# Patient Record
Sex: Male | Born: 1945 | Race: White | Hispanic: No | Marital: Married | Smoking: Never smoker
Health system: Southern US, Community
[De-identification: ages and names within clinical notes are randomized; demographics above are authoritative.]

## PROBLEM LIST (undated history)

## (undated) DIAGNOSIS — M25512 Pain in left shoulder: Secondary | ICD-10-CM

## (undated) DIAGNOSIS — I2510 Atherosclerotic heart disease of native coronary artery without angina pectoris: Secondary | ICD-10-CM

## (undated) DIAGNOSIS — R5383 Other fatigue: Secondary | ICD-10-CM

## (undated) DIAGNOSIS — I209 Angina pectoris, unspecified: Secondary | ICD-10-CM

## (undated) DIAGNOSIS — R079 Chest pain, unspecified: Secondary | ICD-10-CM

## (undated) DIAGNOSIS — E785 Hyperlipidemia, unspecified: Secondary | ICD-10-CM

## (undated) HISTORY — PX: EYE SURGERY: SHX253

## (undated) HISTORY — DX: Hyperlipidemia, unspecified: E78.5

## (undated) HISTORY — PX: CARDIAC CATHETERIZATION: SHX172

## (undated) HISTORY — PX: OTHER SURGICAL HISTORY: SHX169

## (undated) HISTORY — DX: Other fatigue: R53.83

## (undated) HISTORY — DX: Pain in left shoulder: M25.512

## (undated) HISTORY — DX: Chest pain, unspecified: R07.9

---

## 2021-02-26 DIAGNOSIS — R079 Chest pain, unspecified: Secondary | ICD-10-CM | POA: Diagnosis not present

## 2021-02-26 DIAGNOSIS — M25512 Pain in left shoulder: Secondary | ICD-10-CM | POA: Diagnosis not present

## 2021-03-16 ENCOUNTER — Other Ambulatory Visit: Payer: Self-pay

## 2021-03-16 ENCOUNTER — Encounter: Payer: Self-pay | Admitting: Interventional Cardiology

## 2021-03-16 ENCOUNTER — Ambulatory Visit (INDEPENDENT_AMBULATORY_CARE_PROVIDER_SITE_OTHER): Payer: Federal, State, Local not specified - PPO | Admitting: Interventional Cardiology

## 2021-03-16 VITALS — BP 130/84 | HR 53 | Ht 69.0 in | Wt 170.2 lb

## 2021-03-16 DIAGNOSIS — Z8249 Family history of ischemic heart disease and other diseases of the circulatory system: Secondary | ICD-10-CM | POA: Diagnosis not present

## 2021-03-16 DIAGNOSIS — R072 Precordial pain: Secondary | ICD-10-CM | POA: Diagnosis not present

## 2021-03-16 DIAGNOSIS — R0609 Other forms of dyspnea: Secondary | ICD-10-CM

## 2021-03-16 DIAGNOSIS — R0600 Dyspnea, unspecified: Secondary | ICD-10-CM

## 2021-03-16 NOTE — Patient Instructions (Signed)
Medication Instructions:  Your physician recommends that you continue on your current medications as directed. Please refer to the Current Medication list given to you today.  *If you need a refill on your cardiac medications before your next appointment, please call your pharmacy*   Lab Work: Lab work to be done today--BMP If you have labs (blood work) drawn today and your tests are completely normal, you will receive your results only by: Marland Kitchen MyChart Message (if you have MyChart) OR . A paper copy in the mail If you have any lab test that is abnormal or we need to change your treatment, we will call you to review the results.   Testing/Procedures: Your physician has requested that you have an echocardiogram. Echocardiography is a painless test that uses sound waves to create images of your heart. It provides your doctor with information about the size and shape of your heart and how well your heart's chambers and valves are working. This procedure takes approximately one hour. There are no restrictions for this procedure.  Your physician has requested that you have cardiac CT. Cardiac computed tomography (CT) is a painless test that uses an x-ray machine to take clear, detailed pictures of your heart. For further information please visit [email or web address] Please follow instruction sheet as given.      Follow-Up: At [facility], you and your health needs are our priority.  As part of our continuing mission to provide you with exceptional heart care, we have created designated Provider Care Teams.  These Care Teams include your primary Cardiologist (physician) and Advanced Practice Providers (APPs -  Physician Assistants and Nurse Practitioners) who all work together to provide you with the care you need, when you need it.  We recommend signing up for the patient portal called "MyChart".  Sign up information is provided on this After Visit Summary.  MyChart is used to connect with  patients for Virtual Visits (Telemedicine).  Patients are able to view lab/test results, encounter notes, upcoming appointments, etc.  Non-urgent messages can be sent to your provider as well.   To learn more about what you can do with MyChart, go to [email or web address].    Your next appointment:   Based on test results  The format for your next appointment:   In Person  Provider:   You may see Lance Muss, MD or one of the following Advanced Practice Providers on your designated Care Team:    Ronie Spies, PA-C  Jacolyn Reedy, PA-C    Other Instructions Your cardiac CT will be scheduled at one of the below locations:   [facility] [address] [place], [place] [address] [phone]  OR  [facility] [address] [place], [place] [address] [phone]  If scheduled at [facility], please arrive at the [facility] main entrance (entrance A) of [facility] 30 minutes prior to test start time. Proceed to the [facility] Radiology Department (first floor) to check-in and test prep.  If scheduled at [facility], please arrive 15 mins early for check-in and test prep.  Please follow these instructions carefully (unless otherwise directed):  Hold all erectile dysfunction medications at least 3 days (72 hrs) prior to test.  On the Night Before the Test: . Be sure to Drink plenty of water. . Do not consume any caffeinated/decaffeinated beverages or chocolate 12 hours prior to your test. . Do not take any antihistamines 12 hours prior to your  test.  On the Day of the Test: . Drink plenty of water until 1 hour prior to the test. . Do not eat any food 4 hours prior to the test. . You may take your regular medications prior to the test.            After the Test: . Drink plenty of water. . After receiving IV contrast, you may experience a mild flushed feeling.  This is normal. . On occasion, you may experience a mild rash up to 24 hours after the test. This is not dangerous. If this occurs, you can take Benadryl 25 mg and increase your fluid intake. . If you experience trouble breathing, this can be serious. If it is severe call 911 IMMEDIATELY. If it is mild, please call our office. . If you take any of these medications: Glipizide/Metformin, Avandament, Glucavance, please do not take 48 hours after completing test unless otherwise instructed.   Once we have confirmed authorization from your insurance company, we will call you to set up a date and time for your test. Based on how quickly your insurance processes prior authorizations requests, please allow up to 4 weeks to be contacted for scheduling your Cardiac CT appointment. Be advised that routine Cardiac CT appointments could be scheduled as many as 8 weeks after your provider has ordered it.  For non-scheduling related questions, please contact the cardiac imaging nurse navigator should you have any questions/concerns: Rockwell Alexandria, Cardiac Imaging Nurse Navigator Larey Brick, Cardiac Imaging Nurse Navigator [facility] Direct Office Dial: [phone]   For scheduling needs, including cancellations and rescheduling, please call [place], [phone].

## 2021-03-16 NOTE — H&P (View-Only) (Signed)
Cardiology Office Note   Date:  03/16/2021   ID:  Andres Green, DOB 05-27-1946, MRN [id number]  PCP:  Sheliah Hatch, PA-C    No chief complaint on file.  Chest pain/fatigue  Wt Readings from Last 3 Encounters:  03/16/21 170 lb 3.2 oz (77.2 kg)       History of Present Illness: Andres Green is a 75 y.o. male who is being seen today for the evaluation of chest pain at the request of Sheliah Hatch, PA-C.  He has lived in the [place] for many years.  In his younger days, he was very active hiking, skiing.  He has been active gardening, building.    He got married seven years ago, and was doing more yard work/digging etc...  Last October he moved to [facility].  He has been moving furniture and rocks in the yard.  He walks his dog twice a day.    He recently has had some fatigue with activity in the yard.  He had some left chest pain and left arm pain as well.  Walks his dog, and this has become a cause of fatigue as well.   Father had CABG in his 43s after MI. Brother died from noncardiac related causes.   He had several stress tests in the past, last one about 20 years ago.   Past Medical History:  Diagnosis Date  . Chest pain   . Fatigue   . Left shoulder pain        No current outpatient medications on file.   No current facility-administered medications for this visit.    Allergies:   Patient has no allergy information on record.    Social History:  The patient  reports that he has never smoked. He has never used smokeless tobacco. He reports current alcohol use.   Family History:  The patient's family history includes Cancer - Prostate in his father; Diabetes in his mother; Heart disease in his father; Stroke in his mother.    ROS:  Please see the history of present illness.   Otherwise, review of systems are positive for fatigue as noted above.   All other systems are reviewed and negative.    PHYSICAL EXAM: VS:  BP 130/84   Pulse (!) 53   Ht 5'  9" (1.753 m)   Wt 170 lb 3.2 oz (77.2 kg)   SpO2 95%   BMI 25.13 kg/m  , BMI Body mass index is 25.13 kg/m. GEN: Well nourished, well developed, in no acute distress  HEENT: normal  Neck: no JVD, carotid bruits, or masses Cardiac: RRR; no murmurs, rubs, or gallops,no edema  Respiratory:  clear to auscultation bilaterally, normal work of breathing GI: soft, nontender, nondistended, + BS MS: no deformity or atrophy  Skin: warm and dry, no rash Neuro:  Strength and sensation are intact Psych: euthymic mood, full affect   EKG:   The ekg ordered by PMD demonstrates sinus brady, no ST changes   Recent Labs: No results found for requested labs within last 8760 hours.   Lipid Panel No results found for: CHOL, TRIG, HDL, CHOLHDL, VLDL, LDLCALC, LDLDIRECT   Other studies Reviewed: Additional studies/ records that were reviewed today with results demonstrating: normal renal function and Hbg.   ASSESSMENT AND PLAN:  1. Left arm pain/chest pain with fatigue- worse with exertion.   Some typical features, some atypical features.  Plan for CTA coronaries to further eval.  2. Family history of CAD:  Father with early CAD.  3. DOE: Plan for echo as well. Does not appear to be in CHF.    Current medicines are reviewed at length with the patient today.  The patient concerns regarding his medicines were addressed.  The following changes have been made:  No change  Labs/ tests ordered today include:  No orders of the defined types were placed in this encounter.   Recommend 150 minutes/week of aerobic exercise Low fat, low carb, high fiber diet recommended  Disposition:   FU in for tests.    Signed, Lance Muss, MD  03/16/2021 2:40 PM    [facility] [address] [place], [place], [place]  [address] Phone: [phone]; Fax: [phone]

## 2021-03-16 NOTE — Progress Notes (Signed)
  Cardiology Office Note   Date:  03/16/2021   ID:  Andres Green, DOB 01/26/1946, MRN [id number]  PCP:  Breedlove, Brent C, PA-C    No chief complaint on file.  Chest pain/fatigue  Wt Readings from Last 3 Encounters:  03/16/21 170 lb 3.2 oz (77.2 kg)       History of Present Illness: Andres Green is a 75 y.o. male who is being seen today for the evaluation of chest pain at the request of Breedlove, Brent C, PA-C.  [place] has lived in the [place].  In his younger days, he was very active hiking, skiing.  He has been active gardening, building.    He got married seven years ago, and was doing more yard work/digging etc...  Last October he moved to [place].  He has been moving furniture and rocks in the yard.  He walks his dog twice a day.    He recently has had some fatigue with activity in the yard.  He had some left chest pain and left arm pain as well.  Walks his dog, and this has become a cause of fatigue as well.   Father had CABG in his 50s after MI. Brother died from noncardiac related causes.   He had several stress tests in the past, last one about 20 years ago.   Past Medical History:  Diagnosis Date  . Chest pain   . Fatigue   . Left shoulder pain        No current outpatient medications on file.   No current facility-administered medications for this visit.    Allergies:   Patient has no allergy information on record.    Social History:  The patient  reports that he has never smoked. He has never used smokeless tobacco. He reports current alcohol use.   Family History:  The patient's family history includes Cancer - Prostate in his father; Diabetes in his mother; Heart disease in his father; Stroke in his mother.    ROS:  Please see the history of present illness.   Otherwise, review of systems are positive for fatigue as noted above.   All other systems are reviewed and negative.    PHYSICAL EXAM: VS:  BP 130/84   Pulse (!) 53   Ht 5'  9" (1.753 m)   Wt 170 lb 3.2 oz (77.2 kg)   SpO2 95%   BMI 25.13 kg/m  , BMI Body mass index is 25.13 kg/m. GEN: Well nourished, well developed, in no acute distress  HEENT: normal  Neck: no JVD, carotid bruits, or masses Cardiac: RRR; no murmurs, rubs, or gallops,no edema  Respiratory:  clear to auscultation bilaterally, normal work of breathing GI: soft, nontender, nondistended, + BS MS: no deformity or atrophy  Skin: warm and dry, no rash Neuro:  Strength and sensation are intact Psych: euthymic mood, full affect   EKG:   The ekg ordered by PMD demonstrates sinus brady, no ST changes   Recent Labs: No results found for requested labs within last 8760 hours.   Lipid Panel No results found for: CHOL, TRIG, HDL, CHOLHDL, VLDL, LDLCALC, LDLDIRECT   Other studies Reviewed: Additional studies/ records that were reviewed today with results demonstrating: normal renal function and Hbg.   ASSESSMENT AND PLAN:  1. Left arm pain/chest pain with fatigue- worse with exertion.   Some typical features, some atypical features.  Plan for CTA coronaries to further eval.  2. Family history of CAD:   Father with early CAD.  3. DOE: Plan for echo as well. Does not appear to be in CHF.    Current medicines are reviewed at length with the patient today.  The patient concerns regarding his medicines were addressed.  The following changes have been made:  No change  Labs/ tests ordered today include:  No orders of the defined types were placed in this encounter.   Recommend 150 minutes/week of aerobic exercise Low fat, low carb, high fiber diet recommended  Disposition:   FU in for tests.    Signed, Lance Muss, MD  03/16/2021 2:40 PM    [facility] [address] [place], [place], [place]  [address] Phone: [phone]; Fax: [phone]

## 2021-03-17 LAB — BASIC METABOLIC PANEL
BUN/Creatinine Ratio: 15 (ref 10–24)
BUN: 19 mg/dL (ref 8–27)
CO2: 25 mmol/L (ref 20–29)
Calcium: 9.6 mg/dL (ref 8.6–10.2)
Chloride: 103 mmol/L (ref 96–106)
Creatinine, Ser: 1.3 mg/dL — ABNORMAL HIGH (ref 0.76–1.27)
Glucose: 95 mg/dL (ref 65–99)
Potassium: 5.1 mmol/L (ref 3.5–5.2)
Sodium: 144 mmol/L (ref 134–144)
eGFR: 58 mL/min/{1.73_m2} — ABNORMAL LOW (ref >59)

## 2021-03-19 ENCOUNTER — Telehealth (HOSPITAL_COMMUNITY): Payer: Self-pay | Admitting: *Deleted

## 2021-03-19 NOTE — Telephone Encounter (Signed)
Reaching out to patient to offer assistance regarding upcoming cardiac imaging study; pt verbalizes understanding of appt date/time, parking situation and where to check in, pre-test NPO status, and verified current allergies; name and call back number provided for further questions should they arise  Naylene Foell RN Navigator Cardiac Imaging [facility] [phone] office [phone] cell  

## 2021-03-22 ENCOUNTER — Ambulatory Visit (HOSPITAL_COMMUNITY)
Admission: RE | Admit: 2021-03-22 | Discharge: 2021-03-22 | Disposition: A | Discharge status: Home / Self Care | Payer: Federal, State, Local not specified - PPO | Source: Ambulatory Visit | Attending: Interventional Cardiology | Admitting: Interventional Cardiology

## 2021-03-22 ENCOUNTER — Encounter (HOSPITAL_COMMUNITY): Payer: Self-pay

## 2021-03-22 ENCOUNTER — Other Ambulatory Visit: Payer: Self-pay

## 2021-03-22 DIAGNOSIS — R072 Precordial pain: Secondary | ICD-10-CM | POA: Diagnosis not present

## 2021-03-22 DIAGNOSIS — I2510 Atherosclerotic heart disease of native coronary artery without angina pectoris: Secondary | ICD-10-CM | POA: Diagnosis not present

## 2021-03-22 DIAGNOSIS — Z006 Encounter for examination for normal comparison and control in clinical research program: Secondary | ICD-10-CM

## 2021-03-22 MED ORDER — NITROGLYCERIN 0.4 MG SL SUBL
SUBLINGUAL_TABLET | SUBLINGUAL | Status: AC
  Filled 2021-03-22: qty 2

## 2021-03-22 MED ORDER — IOHEXOL 350 MG/ML SOLN
90.0000 mL | Freq: Once | INTRAVENOUS | Status: AC | PRN
  Administered 2021-03-22: 90 mL via INTRAVENOUS

## 2021-03-22 MED ORDER — METOPROLOL TARTRATE 5 MG/5ML IV SOLN: 5.0000 mg | Freq: Once | INTRAVENOUS | Status: AC

## 2021-03-22 MED ORDER — METOPROLOL TARTRATE 5 MG/5ML IV SOLN
INTRAVENOUS | Status: AC
  Administered 2021-03-22: 5 mg via INTRAVENOUS
  Filled 2021-03-22: qty 5

## 2021-03-22 MED ORDER — NITROGLYCERIN 0.4 MG SL SUBL
0.8000 mg | SUBLINGUAL_TABLET | Freq: Once | SUBLINGUAL | Status: AC
  Administered 2021-03-22: 0.8 mg via SUBLINGUAL

## 2021-03-22 NOTE — Research (Signed)
IDENTIFY Informed Consent                  Subject Name: Andres Green   Subject met inclusion and exclusion criteria.  The informed consent form, study requirements and expectations were reviewed with the subject and questions and concerns were addressed prior to the signing of the consent form.  The subject verbalized understanding of the trial requirements.  The subject agreed to participate in the IDENTIFY trial and signed the informed consent at 14:49PM on 03/22/21.  The informed consent was obtained prior to performance of any protocol-specific procedures for the subject.  A copy of the signed informed consent was given to the subject and a copy was placed in the subject's medical record.   Meade Maw , Naval architect

## 2021-03-23 ENCOUNTER — Encounter: Payer: Self-pay | Admitting: *Deleted

## 2021-03-23 ENCOUNTER — Telehealth: Payer: Self-pay | Admitting: *Deleted

## 2021-03-23 DIAGNOSIS — I2510 Atherosclerotic heart disease of native coronary artery without angina pectoris: Secondary | ICD-10-CM | POA: Diagnosis not present

## 2021-03-23 DIAGNOSIS — R072 Precordial pain: Secondary | ICD-10-CM

## 2021-03-23 DIAGNOSIS — Z01812 Encounter for preprocedural laboratory examination: Secondary | ICD-10-CM

## 2021-03-23 MED ORDER — NITROGLYCERIN 0.4 MG SL SUBL: 0.4000 mg | SUBLINGUAL_TABLET | SUBLINGUAL | 6 refills | Status: DC | PRN

## 2021-03-23 MED ORDER — ASPIRIN EC 81 MG PO TBEC: 81.0000 mg | DELAYED_RELEASE_TABLET | Freq: Every day | ORAL | 3 refills | Status: AC

## 2021-03-23 MED ORDER — ISOSORBIDE MONONITRATE ER 30 MG PO TB24
15.0000 mg | ORAL_TABLET | Freq: Every day | ORAL | 2 refills | Status: DC
Start: 2021-03-23 — End: 2021-04-13

## 2021-03-23 NOTE — Telephone Encounter (Signed)
I spoke with patient and gave him options of May 10 and May 13 for cath.  He will check with his family.  Patient aware he will need covid testing and will have to quarantine after test. Will also need BMP and CBC.   I gave patient medication instructions.  Use of NTG explained to patient.  Prescriptions sent to [facility] in [facility]. He has already started aspirin 81 mg daily today.

## 2021-03-23 NOTE — Progress Notes (Signed)
I spoke to the patient about his CT.  We agreed that cath is the next step.  Cardiac catheterization was discussed with the patient fully. The patient understands that risks include but are not limited to stroke (1 in 1000), death (1 in 1000), kidney failure [usually temporary] (1 in 500), bleeding (1 in 200), allergic reaction [possibly serious] (1 in 200).  The patient understands and is willing to proceed.    He has some dental work.  I advised him that it was likely his dentist would want his heart fixed prior to invasive dental work.  Start aspirin 81 mg daily.  PLease call in SL NTG.  OK to try Imdur 15 mg daily. Please schedule cath.

## 2021-03-23 NOTE — Telephone Encounter (Signed)
-----   Message from Corky Crafts, MD sent at 03/23/2021  1:28 PM EDT ----- I spoke to the patient about his CT.  We agreed that cath is the next step.  Cardiac catheterization was discussed with the patient fully. The patient understands that risks include but are not limited to stroke (1 in 1000), death (1 in 1000), kidney failure [usually temporary] (1 in 500), bleeding (1 in 200), allergic reaction [possibly serious] (1 in 200).  The patient understands and is willing to proceed.    He has some dental work.  I advised him that it was likely his dentist would want his heart fixed prior to invasive dental work.  Start aspirin 81 mg daily.  PLease call in SL NTG.  OK to try Imdur 15 mg daily. Please schedule cath.

## 2021-03-23 NOTE — Telephone Encounter (Signed)
Patient would like to have cath done on May 10.  He will have covid screen at 8:45 on May 9,2022.  He will stop by office on 5/4 for lab work. Cath scheduled for 03/30/21 at 10:30.  I verbally went over instructions with patient and will leave copy at front desk for him to pick up when he comes in for lab work on 5/4

## 2021-03-24 ENCOUNTER — Other Ambulatory Visit: Payer: Federal, State, Local not specified - PPO | Admitting: *Deleted

## 2021-03-24 ENCOUNTER — Other Ambulatory Visit: Payer: Self-pay

## 2021-03-24 DIAGNOSIS — Z01812 Encounter for preprocedural laboratory examination: Secondary | ICD-10-CM

## 2021-03-24 DIAGNOSIS — R072 Precordial pain: Secondary | ICD-10-CM | POA: Diagnosis not present

## 2021-03-25 LAB — BASIC METABOLIC PANEL
BUN/Creatinine Ratio: 16 (ref 10–24)
BUN: 17 mg/dL (ref 8–27)
CO2: 25 mmol/L (ref 20–29)
Calcium: 9.4 mg/dL (ref 8.6–10.2)
Chloride: 103 mmol/L (ref 96–106)
Creatinine, Ser: 1.05 mg/dL (ref 0.76–1.27)
Glucose: 96 mg/dL (ref 65–99)
Potassium: 4.4 mmol/L (ref 3.5–5.2)
Sodium: 141 mmol/L (ref 134–144)
eGFR: 75 mL/min/{1.73_m2} (ref >59)

## 2021-03-25 LAB — CBC
Hematocrit: 40.9 % (ref 37.5–51.0)
Hemoglobin: 14.2 g/dL (ref 13.0–17.7)
MCH: 32.5 pg (ref 26.6–33.0)
MCHC: 34.7 g/dL (ref 31.5–35.7)
MCV: 94 fL (ref 79–97)
Platelets: 223 10*3/uL (ref 150–450)
RBC: 4.37 x10E6/uL (ref 4.14–5.80)
RDW: 12.6 % (ref 11.6–15.4)
WBC: 6 10*3/uL (ref 3.4–10.8)

## 2021-03-29 ENCOUNTER — Other Ambulatory Visit (HOSPITAL_COMMUNITY)
Admission: RE | Admit: 2021-03-29 | Discharge: 2021-03-29 | Disposition: A | Discharge status: Home / Self Care | Payer: Federal, State, Local not specified - PPO | Source: Ambulatory Visit | Attending: Interventional Cardiology | Admitting: Interventional Cardiology

## 2021-03-29 ENCOUNTER — Telehealth: Payer: Self-pay | Admitting: *Deleted

## 2021-03-29 DIAGNOSIS — Z01812 Encounter for preprocedural laboratory examination: Secondary | ICD-10-CM | POA: Insufficient documentation

## 2021-03-29 DIAGNOSIS — Z20822 Contact with and (suspected) exposure to covid-19: Secondary | ICD-10-CM | POA: Insufficient documentation

## 2021-03-29 DIAGNOSIS — R5383 Other fatigue: Secondary | ICD-10-CM | POA: Diagnosis not present

## 2021-03-29 DIAGNOSIS — I2510 Atherosclerotic heart disease of native coronary artery without angina pectoris: Secondary | ICD-10-CM | POA: Diagnosis not present

## 2021-03-29 DIAGNOSIS — R931 Abnormal findings on diagnostic imaging of heart and coronary circulation: Secondary | ICD-10-CM | POA: Diagnosis not present

## 2021-03-29 DIAGNOSIS — R0609 Other forms of dyspnea: Secondary | ICD-10-CM | POA: Diagnosis not present

## 2021-03-29 DIAGNOSIS — Z8249 Family history of ischemic heart disease and other diseases of the circulatory system: Secondary | ICD-10-CM | POA: Diagnosis not present

## 2021-03-29 LAB — SARS CORONAVIRUS 2 (TAT 6-24 HRS): SARS Coronavirus 2: NEGATIVE

## 2021-03-29 NOTE — Telephone Encounter (Signed)
Pt contacted pre-catheterization scheduled at [facility] for: Tuesday Mar 30, 2021 10:30 AM Verified arrival time and place: [facility] Main Entrance [facility]) at: 8:30 AM   No solid food after midnight prior to cath, clear liquids until 5 AM day of procedure.   AM meds can be  taken pre-cath with sips of water including: ASA 81 mg   Confirmed patient has responsible adult to drive home post procedure and be with patient first 24 hours after arriving home: yes  You are allowed ONE visitor in the waiting room during the time you are at the hospital for your procedure. Both you and your visitor must wear a mask once you enter the hospital.  Reviewed procedure/mask/visitor instructions with patient.

## 2021-03-30 ENCOUNTER — Ambulatory Visit (HOSPITAL_COMMUNITY)
Admission: RE | Admit: 2021-03-30 | Discharge: 2021-03-30 | Disposition: A | Discharge status: Home / Self Care | Payer: Federal, State, Local not specified - PPO | Attending: Interventional Cardiology | Admitting: Interventional Cardiology

## 2021-03-30 ENCOUNTER — Other Ambulatory Visit: Payer: Self-pay | Admitting: *Deleted

## 2021-03-30 ENCOUNTER — Ambulatory Visit (HOSPITAL_COMMUNITY)
Admit: 2021-03-30 | Discharge: 2021-03-30 | Disposition: A | Discharge status: Home / Self Care | Payer: Federal, State, Local not specified - PPO | Attending: Surgery | Admitting: Surgery

## 2021-03-30 ENCOUNTER — Other Ambulatory Visit: Payer: Self-pay

## 2021-03-30 ENCOUNTER — Ambulatory Visit (HOSPITAL_COMMUNITY)
Admission: RE | Disposition: A | Discharge status: Home / Self Care | Payer: Self-pay | Source: Home / Self Care | Attending: Interventional Cardiology

## 2021-03-30 DIAGNOSIS — I2510 Atherosclerotic heart disease of native coronary artery without angina pectoris: Secondary | ICD-10-CM | POA: Diagnosis not present

## 2021-03-30 DIAGNOSIS — Z8249 Family history of ischemic heart disease and other diseases of the circulatory system: Secondary | ICD-10-CM | POA: Diagnosis not present

## 2021-03-30 DIAGNOSIS — R5383 Other fatigue: Secondary | ICD-10-CM | POA: Insufficient documentation

## 2021-03-30 DIAGNOSIS — Z20822 Contact with and (suspected) exposure to covid-19: Secondary | ICD-10-CM | POA: Insufficient documentation

## 2021-03-30 DIAGNOSIS — R931 Abnormal findings on diagnostic imaging of heart and coronary circulation: Secondary | ICD-10-CM

## 2021-03-30 DIAGNOSIS — R0609 Other forms of dyspnea: Secondary | ICD-10-CM | POA: Insufficient documentation

## 2021-03-30 HISTORY — PX: LEFT HEART CATH AND CORONARY ANGIOGRAPHY: CATH118249

## 2021-03-30 SURGERY — LEFT HEART CATH AND CORONARY ANGIOGRAPHY
Anesthesia: LOCAL

## 2021-03-30 MED ORDER — SODIUM CHLORIDE 0.9 % IV SOLN: INTRAVENOUS | Status: AC

## 2021-03-30 MED ORDER — VERAPAMIL HCL 2.5 MG/ML IV SOLN
INTRAVENOUS | Status: AC
  Filled 2021-03-30: qty 2

## 2021-03-30 MED ORDER — LABETALOL HCL 5 MG/ML IV SOLN: 10.0000 mg | INTRAVENOUS | Status: DC | PRN

## 2021-03-30 MED ORDER — FENTANYL CITRATE (PF) 100 MCG/2ML IJ SOLN
INTRAMUSCULAR | Status: AC
  Filled 2021-03-30: qty 2

## 2021-03-30 MED ORDER — IOHEXOL 350 MG/ML SOLN
INTRAVENOUS | Status: DC | PRN
  Administered 2021-03-30: 55 mL

## 2021-03-30 MED ORDER — SODIUM CHLORIDE 0.9 % IV SOLN: 250.0000 mL | INTRAVENOUS | Status: DC | PRN

## 2021-03-30 MED ORDER — SODIUM CHLORIDE 0.9% FLUSH: 3.0000 mL | INTRAVENOUS | Status: DC | PRN

## 2021-03-30 MED ORDER — ASPIRIN 81 MG PO CHEW: 81.0000 mg | CHEWABLE_TABLET | ORAL | Status: DC

## 2021-03-30 MED ORDER — HEPARIN (PORCINE) IN NACL 1000-0.9 UT/500ML-% IV SOLN
INTRAVENOUS | Status: AC
  Filled 2021-03-30: qty 500

## 2021-03-30 MED ORDER — ACETAMINOPHEN 325 MG PO TABS: 650.0000 mg | ORAL_TABLET | ORAL | Status: DC | PRN

## 2021-03-30 MED ORDER — MIDAZOLAM HCL 2 MG/2ML IJ SOLN
INTRAMUSCULAR | Status: AC
  Filled 2021-03-30: qty 2

## 2021-03-30 MED ORDER — SODIUM CHLORIDE 0.9 % WEIGHT BASED INFUSION: 1.0000 mL/kg/h | INTRAVENOUS | Status: DC

## 2021-03-30 MED ORDER — FENTANYL CITRATE (PF) 100 MCG/2ML IJ SOLN
INTRAMUSCULAR | Status: DC | PRN
  Administered 2021-03-30: 25 ug via INTRAVENOUS

## 2021-03-30 MED ORDER — HYDRALAZINE HCL 20 MG/ML IJ SOLN: 10.0000 mg | INTRAMUSCULAR | Status: DC | PRN

## 2021-03-30 MED ORDER — LIDOCAINE HCL (PF) 1 % IJ SOLN
INTRAMUSCULAR | Status: AC
  Filled 2021-03-30: qty 30

## 2021-03-30 MED ORDER — LIDOCAINE HCL (PF) 1 % IJ SOLN
INTRAMUSCULAR | Status: DC | PRN
  Administered 2021-03-30: 2 mL

## 2021-03-30 MED ORDER — ONDANSETRON HCL 4 MG/2ML IJ SOLN
4.0000 mg | Freq: Four times a day (QID) | INTRAMUSCULAR | Status: DC | PRN
Start: 2021-03-30 — End: 2021-03-30

## 2021-03-30 MED ORDER — SODIUM CHLORIDE 0.9% FLUSH: 3.0000 mL | Freq: Two times a day (BID) | INTRAVENOUS | Status: DC

## 2021-03-30 MED ORDER — MIDAZOLAM HCL 2 MG/2ML IJ SOLN
INTRAMUSCULAR | Status: DC | PRN
  Administered 2021-03-30: 2 mg via INTRAVENOUS

## 2021-03-30 MED ORDER — HEPARIN (PORCINE) IN NACL 1000-0.9 UT/500ML-% IV SOLN
INTRAVENOUS | Status: DC | PRN
  Administered 2021-03-30 (×2): 500 mL

## 2021-03-30 MED ORDER — SODIUM CHLORIDE 0.9 % WEIGHT BASED INFUSION
3.0000 mL/kg/h | INTRAVENOUS | Status: AC
  Administered 2021-03-30: 3 mL/kg/h via INTRAVENOUS

## 2021-03-30 MED ORDER — HEPARIN SODIUM (PORCINE) 1000 UNIT/ML IJ SOLN
INTRAMUSCULAR | Status: AC
  Filled 2021-03-30: qty 1

## 2021-03-30 MED ORDER — HEPARIN SODIUM (PORCINE) 1000 UNIT/ML IJ SOLN
INTRAMUSCULAR | Status: DC | PRN
  Administered 2021-03-30: 4000 [IU] via INTRAVENOUS

## 2021-03-30 SURGICAL SUPPLY — 9 items
CATH 5FR JL3.5 JR4 ANG PIG MP (CATHETERS) ×2 IMPLANT
DEVICE RAD COMP TR BAND LRG (VASCULAR PRODUCTS) ×2 IMPLANT
GLIDESHEATH SLEND SS 6F .021 (SHEATH) ×2 IMPLANT
GUIDEWIRE INQWIRE 1.5J.035X260 (WIRE) ×1 IMPLANT
INQWIRE 1.5J .035X260CM (WIRE) ×2
KIT HEART LEFT (KITS) ×2 IMPLANT
PACK CARDIAC CATHETERIZATION (CUSTOM PROCEDURE TRAY) ×2 IMPLANT
TRANSDUCER W/STOPCOCK (MISCELLANEOUS) ×2 IMPLANT
TUBING CIL FLEX 10 FLL-RA (TUBING) ×2 IMPLANT

## 2021-03-30 NOTE — Progress Notes (Signed)
  Echocardiogram 2D Echocardiogram has been attempted. Patient Discharged. Nurse notified.  Andres Green G Andres Green 03/30/2021, 1:12 PM

## 2021-03-30 NOTE — Discharge Instructions (Signed)
Radial Site Care  This sheet gives you information about how to care for yourself after your procedure. Your health care provider may also give you more specific instructions. If you have problems or questions, contact your health care provider. What can I expect after the procedure? After the procedure, it is common to have:  Bruising and tenderness at the catheter insertion area. Follow these instructions at home: Medicines  Take over-the-counter and prescription medicines only as told by your health care provider. Insertion site care  Follow instructions from your health care provider about how to take care of your insertion site. Make sure you: ? Wash your hands with soap and water before you change your bandage (dressing). If soap and water are not available, use hand sanitizer. ? Change your dressing as told by your health care provider. ? Leave stitches (sutures), skin glue, or adhesive strips in place. These skin closures may need to stay in place for 2 weeks or longer. If adhesive strip edges start to loosen and curl up, you may trim the loose edges. Do not remove adhesive strips completely unless your health care provider tells you to do that.  Check your insertion site every day for signs of infection. Check for: ? Redness, swelling, or pain. ? Fluid or blood. ? Pus or a bad smell. ? Warmth.  Do not take baths, swim, or use a hot tub until your health care provider approves.  You may shower 24-48 hours after the procedure, or as directed by your health care provider. ? Remove the dressing and gently wash the site with plain soap and water. ? Pat the area dry with a clean towel. ? Do not rub the site. That could cause bleeding.  Do not apply powder or lotion to the site. Activity  For 24 hours after the procedure, or as directed by your health care provider: ? Do not flex or bend the affected arm. ? Do not push or pull heavy objects with the affected arm. ? Do not drive  yourself home from the hospital or clinic. You may drive 24 hours after the procedure unless your health care provider tells you not to. ? Do not operate machinery or power tools.  Do not lift anything that is heavier than 10 lb (4.5 kg), or the limit that you are told, until your health care provider says that it is safe.  Ask your health care provider when it is okay to: ? Return to work or school. ? Resume usual physical activities or sports. ? Resume sexual activity.   General instructions  If the catheter site starts to bleed, raise your arm and put firm pressure on the site. If the bleeding does not stop, get help right away. This is a medical emergency.  If you went home on the same day as your procedure, a responsible adult should be with you for the first 24 hours after you arrive home.  Keep all follow-up visits as told by your health care provider. This is important. Contact a health care provider if:  You have a fever.  You have redness, swelling, or yellow drainage around your insertion site. Get help right away if:  You have unusual pain at the radial site.  The catheter insertion area swells very fast.  The insertion area is bleeding, and the bleeding does not stop when you hold steady pressure on the area.  Your arm or hand becomes pale, cool, tingly, or numb. These symptoms may represent a serious   problem that is an emergency. Do not wait to see if the symptoms will go away. Get medical help right away. Call your local emergency services (911 in the [place] Do not drive yourself to the hospital. Summary  After the procedure, it is common to have bruising and tenderness at the site.  Follow instructions from your health care provider about how to take care of your radial site wound. Check the wound every day for signs of infection.  Do not lift anything that is heavier than 10 lb (4.5 kg), or the limit that you are told, until your health care provider says that it  is safe. This information is not intended to replace advice given to you by your health care provider. Make sure you discuss any questions you have with your health care provider. Document Revised: 12/13/2017 Document Reviewed: 12/13/2017 Elsevier Patient Education  2021 Elsevier Inc.  

## 2021-03-30 NOTE — Interval H&P Note (Signed)
Cath Lab Visit (complete for each Cath Lab visit)  Clinical Evaluation Leading to the Procedure:   ACS: No.  Non-ACS:    Anginal Classification: CCS III  Anti-ischemic medical therapy: Minimal Therapy (1 class of medications)  Non-Invasive Test Results: No non-invasive testing performed  Prior CABG: No previous CABG  Abnormal CTA coronaries    History and Physical Interval Note:  03/30/2021 8:51 AM  Andres Green  has presented today for surgery, with the diagnosis of Chest Pain.  The various methods of treatment have been discussed with the patient and family. After consideration of risks, benefits and other options for treatment, the patient has consented to  Procedure(s): LEFT HEART CATH AND CORONARY ANGIOGRAPHY (N/A) as a surgical intervention.  The patient's history has been reviewed, patient examined, no change in status, stable for surgery.  I have reviewed the patient's chart and labs.  Questions were answered to the patient's satisfaction.     Lance Muss

## 2021-03-31 ENCOUNTER — Encounter: Payer: Self-pay | Admitting: *Deleted

## 2021-03-31 ENCOUNTER — Encounter (HOSPITAL_COMMUNITY): Payer: Self-pay | Admitting: Interventional Cardiology

## 2021-03-31 ENCOUNTER — Other Ambulatory Visit: Payer: Self-pay | Admitting: *Deleted

## 2021-03-31 ENCOUNTER — Institutional Professional Consult (permissible substitution): Payer: Federal, State, Local not specified - PPO | Admitting: Surgery

## 2021-03-31 VITALS — BP 131/69 | HR 64 | Resp 20 | Ht 68.0 in | Wt 172.0 lb

## 2021-03-31 DIAGNOSIS — I2510 Atherosclerotic heart disease of native coronary artery without angina pectoris: Secondary | ICD-10-CM | POA: Diagnosis not present

## 2021-03-31 MED FILL — Verapamil HCl IV Soln 2.5 MG/ML: INTRAVENOUS | Qty: 2 | Status: AC

## 2021-03-31 NOTE — Progress Notes (Signed)
Cardiothoracic Surgery Consultation  PCP is Verl Blalock Referring Provider is Corky Crafts, MD  Chief Complaint  Patient presents with  . Coronary Artery Disease    Initial surgical consult, cath 5/10, ECHO 5/11    HPI:  The patient is a 75 year old gentleman with no prior cardiac history who reports developing fatigue and substernal chest discomfort radiating down his left arm while pushing a lawnmower a couple weeks ago.  This resolved with rest but he had a couple more episodes and presented to [facility] internal medicine in [facility] where he lives.  He said that initial testing did not show anything.  He had further episodes and return to his PCP and was referred to cardiology.  He underwent a gated cardiac CTA on 03/22/2021 which showed a calcium score of 382 putting him in the 62nd percentile for his age and sex.  There was significant disease in the LAD and further FFR analysis showed a significant decrease in the mid and distal LAD of 0.53, 0.54, and 0.50.  The first diagonal also was abnormal at its bifurcation from the LAD with an FFR of 0.54.  The left main, left circumflex and right coronary artery were normal.  He subsequently underwent cardiac catheterization yesterday which showed severe three-vessel coronary disease with a 90% proximal LAD stenosis with 50% mid and distal stenoses..  The first diagonal had about 75% ostial stenosis.  The left circumflex had 75% proximal to mid stenosis with 80% stenosis and a second marginal branch.  The right coronary artery was diffusely irregular with 90% stenosis at the ostium of the PDA.  Left ventricular ejection fraction was 55 to 65%.  There is no aortic stenosis.  The patient is married and lives with his wife in [place].  He lived in [place] for many years and just moved to [place] in the past year.  He and his wife care for their 32-year-old autistic grandson.  He is a  lifelong non-smoker.  His father had coronary bypass surgery after an MI in his 62s.  Past Medical History:  Diagnosis Date  . Chest pain   . Fatigue   . Left shoulder pain     Past Surgical History:  Procedure Laterality Date  . LEFT HEART CATH AND CORONARY ANGIOGRAPHY N/A 03/30/2021   Procedure: LEFT HEART CATH AND CORONARY ANGIOGRAPHY;  Surgeon: Corky Crafts, MD;  Location: [facility] INVASIVE CV LAB;  Service: Cardiovascular;  Laterality: N/A;  . WIDSOM TOOTH REMOVAL      Family History  Problem Relation Age of Onset  . Stroke Mother   . Diabetes Mother   . Heart disease Father   . Cancer - Prostate Father     Social History Social History   Tobacco Use  . Smoking status: Never Smoker  . Smokeless tobacco: Never Used  Substance Use Topics  . Alcohol use: Yes    Current Outpatient Medications  Medication Sig Dispense Refill  . acetaminophen (TYLENOL) 500 MG tablet Take 500-1,000 mg by mouth every 6 (six) hours as needed (headaches/pain).    Marland Kitchen aspirin EC 81 MG tablet Take 1 tablet (81 mg total) by mouth daily. Swallow whole. (Patient taking differently: Take 81 mg by mouth in the morning. Swallow whole.) 90 tablet 3  . calamine lotion Apply 1 application topically as needed (poison ivy rash).    . isosorbide mononitrate (IMDUR) 30 MG 24 hr tablet Take 0.5 tablets (15 mg total)  by mouth daily. (Patient taking differently: Take 15 mg by mouth in the morning.) 45 tablet 2  . nitroGLYCERIN (NITROSTAT) 0.4 MG SL tablet Place 1 tablet (0.4 mg total) under the tongue every 5 (five) minutes as needed for chest pain. (Patient taking differently: Place 0.4 mg under the tongue every 5 (five) minutes x 3 doses as needed for chest pain.) 25 tablet 6   No current facility-administered medications for this visit.    No Known Allergies  Review of Systems  Constitutional: Positive for activity change and fatigue.  HENT: Negative.   Eyes: Negative.   Respiratory: Positive for chest  tightness and shortness of breath.   Cardiovascular: Positive for chest pain. Negative for palpitations and leg swelling.  Endocrine: Negative.   Genitourinary: Negative.   Musculoskeletal: Negative.   Allergic/Immunologic: Negative.   Neurological: Negative for dizziness and syncope.  Hematological: Negative.   Psychiatric/Behavioral: Negative.     BP 131/69 (BP Location: Right Arm, Patient Position: Sitting)   Pulse 64   Resp 20   Ht 5\' 8"  (1.727 m)   Wt 172 lb (78 kg)   SpO2 98% Comment: RA  BMI 26.15 kg/m  Physical Exam Constitutional:      Appearance: Normal appearance. He is normal weight.  HENT:     Head: Normocephalic and atraumatic.  Eyes:     Extraocular Movements: Extraocular movements intact.     Conjunctiva/sclera: Conjunctivae normal.     Pupils: Pupils are equal, round, and reactive to light.  Neck:     Vascular: No carotid bruit.  Cardiovascular:     Rate and Rhythm: Normal rate and regular rhythm.     Heart sounds: No murmur heard.   Pulmonary:     Effort: Pulmonary effort is normal.     Breath sounds: Normal breath sounds.  Abdominal:     General: Abdomen is flat.     Palpations: Abdomen is soft.  Musculoskeletal:        General: No swelling. Normal range of motion.     Cervical back: Normal range of motion and neck supple.  Lymphadenopathy:     Cervical: No cervical adenopathy.  Skin:    General: Skin is warm and dry.  Neurological:     General: No focal deficit present.     Mental Status: He is alert and oriented to person, place, and time.  Psychiatric:        Mood and Affect: Mood normal.        Behavior: Behavior normal.        Thought Content: Thought content normal.        Judgment: Judgment normal.      Diagnostic Tests:  Physicians  Panel Physicians Referring Physician Case Authorizing Physician  Corky CraftsVaranasi, Jayadeep S, MD (Primary)      Procedures  LEFT HEART CATH AND CORONARY ANGIOGRAPHY   Conclusion    Prox LAD  lesion is 90% stenosed.  1st Diag lesion is 75% stenosed.  Mid LAD lesion is 50% stenosed.  2nd Diag lesion is 75% stenosed.  Dist LAD lesion is 50% stenosed.  2nd Mrg lesion is 80% stenosed.  Prox Cx to Mid Cx lesion is 75% stenosed.  Ost Cx lesion is 50% stenosed.  RPDA lesion is 90% stenosed.  The left ventricular systolic function is normal.  LV end diastolic pressure is normal.  The left ventricular ejection fraction is 55-65% by visual estimate.  There is no aortic valve stenosis.   Cardiac surgery consult planned due  to multivessel disease.    Patient not having any rest pain.  I stressed to him and his wife that he needs to avoid any exertion that brings on chest pressure.     Recommendations  Antiplatelet/Anticoag Recommend uninterrupted dual antiplatelet therapy with Aspirin 81mg  daily.  Discharge Date In the absence of any other complications or medical issues, we expect the patient to be ready for discharge from a cath perspective on 03/30/2021.   Indications  Abnormal cardiac CT angiography [R93.1 (ICD-10-CM)]   Procedural Details  Technical Details The risks, benefits, and details of the procedure were explained to the patient.  The patient verbalized understanding and wanted to proceed.  Informed written consent was obtained.  PROCEDURE TECHNIQUE:  After Xylocaine anesthesia a 14F slender sheath was placed in the right radial artery with a single anterior needle wall stick.   IV Heparin was given.  Left ventriculography was done using a JR4 catheter. Right coronary angiography was done using a Judkins R4 guide catheter.  Left coronary angiography was done using a Judkins L3.5 guide catheter.   A TR band was used for hemostasis.  Contrast: 55 cc  Estimated blood loss <50 mL.   During this procedure medications were administered to achieve and maintain moderate conscious sedation while the patient's heart rate, blood pressure, and oxygen saturation were  continuously monitored and I was present face-to-face 100% of this time.   Medications (Filter: Administrations occurring from 0940 to 1011 on 03/30/21) (important) Continuous medications are totaled by the amount administered until 03/30/21 1011.    Heparin (Porcine) in NaCl 1000-0.9 UT/500ML-% SOLN (mL) Total volume:  1,000 mL  Date/Time Rate/Dose/Volume Action   03/30/21 0942 500 mL Given   0943 500 mL Given    fentaNYL (SUBLIMAZE) injection (mcg) Total dose:  25 mcg  Date/Time Rate/Dose/Volume Action   03/30/21 0943 25 mcg Given    midazolam (VERSED) injection (mg) Total dose:  2 mg  Date/Time Rate/Dose/Volume Action   03/30/21 0943 2 mg Given    lidocaine (PF) (XYLOCAINE) 1 % injection (mL) Total volume:  2 mL  Date/Time Rate/Dose/Volume Action   03/30/21 0948 2 mL Given    heparin sodium (porcine) injection (Units) Total dose:  4,000 Units  Date/Time Rate/Dose/Volume Action   03/30/21 0950 4,000 Units Given    iohexol (OMNIPAQUE) 350 MG/ML injection (mL) Total volume:  55 mL  Date/Time Rate/Dose/Volume Action   03/30/21 1009 55 mL Given    Sedation Time  Sedation Time Physician-1: 19 minutes 32 seconds   Contrast  Medication Name Total Dose  iohexol (OMNIPAQUE) 350 MG/ML injection 55 mL    Radiation/Fluoro  Fluoro time: 2.4 (min) DAP: 14575 (mGycm2) Cumulative Air Kerma: 295 (mGy)   Complications   Complications documented before study signed (03/30/2021 10:37 AM)    No complications were associated with this study.  Documented by 05/30/2021, MD - 03/30/2021 10:25 AM     Coronary Findings   Diagnostic Dominance: Right  Left Anterior Descending  Prox LAD lesion is 90% stenosed.  Mid LAD lesion is 50% stenosed.  Dist LAD lesion is 50% stenosed.  First Diagonal Branch  1st Diag lesion is 75% stenosed.  Second Diagonal Branch  Vessel is small in size.  2nd Diag lesion is 75% stenosed.  Left Circumflex  Ost Cx lesion  is 50% stenosed. The lesion is eccentric.  Prox Cx to Mid Cx lesion is 75% stenosed.  First Obtuse Marginal Branch  Vessel is small in size.  Second Obtuse Marginal Branch  2nd Mrg lesion is 80% stenosed.  Right Coronary Artery  The vessel exhibits minimal luminal irregularities.  Right Posterior Descending Artery  RPDA lesion is 90% stenosed.   Intervention   No interventions have been documented.  Wall Motion  Resting       All segments of the heart are normal.           Left Heart  Left Ventricle The left ventricular size is normal. The left ventricular systolic function is normal. LV end diastolic pressure is normal. The left ventricular ejection fraction is 55-65% by visual estimate. No regional wall motion abnormalities.  Aortic Valve There is no aortic valve stenosis.   Coronary Diagrams   Diagnostic Dominance: Right    Intervention    Implants    No implant documentation for this case.   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  I agree that the best treatment for him is coronary bypass graft surgery to resolve his symptoms, increase his quality of life, and improve his long-term prognosis.  I reviewed the cardiac cath films with him and answered all of his questions.  I discussed the operative procedure with the patient including  alternatives, benefits and risks; including but not limited to bleeding, blood transfusion, infection, stroke, myocardial infarction, graft failure, heart block requiring a permanent pacemaker, organ dysfunction, and death.  Shelly Flatten understands and agrees to proceed.   Plan:  Coronary bypass graft surgery on Thursday, 04/08/2021.  I spent 60 minutes performing this consultation and > 50% of this time was spent face to face counseling and coordinating the care of this patient's severe multivessel coronary disease.  Alleen Borne, MD [facility] [phone]

## 2021-04-01 ENCOUNTER — Encounter: Payer: Federal, State, Local not specified - PPO | Admitting: Surgery

## 2021-04-01 ENCOUNTER — Other Ambulatory Visit: Payer: Self-pay

## 2021-04-01 ENCOUNTER — Ambulatory Visit (HOSPITAL_BASED_OUTPATIENT_CLINIC_OR_DEPARTMENT_OTHER)
Admission: RE | Admit: 2021-04-01 | Discharge: 2021-04-01 | Disposition: A | Discharge status: Home / Self Care | Payer: Federal, State, Local not specified - PPO | Source: Ambulatory Visit | Attending: Surgery | Admitting: Surgery

## 2021-04-01 ENCOUNTER — Ambulatory Visit (HOSPITAL_COMMUNITY)
Admission: RE | Admit: 2021-04-01 | Discharge: 2021-04-01 | Disposition: A | Discharge status: Home / Self Care | Payer: Federal, State, Local not specified - PPO | Source: Ambulatory Visit | Attending: Surgery | Admitting: Surgery

## 2021-04-01 DIAGNOSIS — I080 Rheumatic disorders of both mitral and aortic valves: Secondary | ICD-10-CM | POA: Diagnosis not present

## 2021-04-01 DIAGNOSIS — I2510 Atherosclerotic heart disease of native coronary artery without angina pectoris: Secondary | ICD-10-CM

## 2021-04-01 LAB — ECHOCARDIOGRAM COMPLETE
Area-P 1/2: 1.94 cm2
S' Lateral: 3 cm

## 2021-04-01 NOTE — Progress Notes (Signed)
  Echocardiogram 2D Echocardiogram has been performed.  Augustine Radar 04/01/2021, 2:42 PM

## 2021-04-05 NOTE — Progress Notes (Addendum)
Anesthesia Note:  Case: [id number] Date/Time: 04/08/21 0730   Procedures:      CORONARY ARTERY BYPASS GRAFTING (CABG) (N/A Chest)     TRANSESOPHAGEAL ECHOCARDIOGRAM (TEE) (N/A )   Anesthesia type: General   Pre-op diagnosis: CAD   [facility] ROOM [facility] / [facility] OR   Surgeons: Alleen Borne, MD      DISCUSSION: Patient is a 75 year old male scheduled for the above procedure. PAT is scheduled for 04/06/21. He moved to [place] from the [place] ~ 08/2020. He is a never smoker who noticed fatigue and exertional left chest and arm pain while doing yard work earlier this year. + Family history of early CAD in his father. Work-up ultimately included a cardiac cath showing multivessel CAD including 90% proximal LAD. Known stable angina symptoms. Advised to avoid strenuous activity and referred to CT surgery for CABG. On Imur. Baseline EKG SB at 45 bpm. PAT is scheduled for 04/06/21.   Received a message for [facility] RN Ryan on 04/05/21 indicating the "this patient has poison ivy on his tummy and thighs. He is for CABG on Thursday with Bartle. Bartle is aware and good to proceed as long as it doesn't become severe and we plan to start Benadryl tonight."  UPDATE 04/07/21 10:37 AM: Preoperative results reviewed. Anesthesia team to evaluate on the day of surgery.   VS: BP 108/63   Pulse (!) 52   Temp 37.2 C (Oral)   Resp 20   Ht 5\' 9"  (1.753 m)   Wt 77.7 kg   SpO2 100%   BMI 25.31 kg/m   PROVIDERS: , PA-C is PCP  Sheliah Hatch, MD is cardiologist   LABS: Pending PAT visit.  (all labs ordered are listed, but only abnormal results are displayed)  Labs Reviewed  COMPREHENSIVE METABOLIC PANEL - Abnormal; Notable for the following components:      Result Value   Glucose, Bld 102 (*)    Total Protein 6.4 (*)    AST 14 (*)    All other components within normal limits  URINALYSIS, ROUTINE W REFLEX MICROSCOPIC - Abnormal; Notable for the following components:   Color, Urine AMBER  (*)    All other components within normal limits  BLOOD GAS, ARTERIAL - Abnormal; Notable for the following components:   Allens test (pass/fail) BRACHIAL ARTERY (*)    All other components within normal limits  SURGICAL PCR SCREEN  SARS CORONAVIRUS 2 (TAT 6-24 HRS)  CBC  PROTIME-INR  APTT  HEMOGLOBIN A1C  TYPE AND SCREEN    IMAGES: CXR 04/06/21: FINDINGS: Cardiomediastinal silhouette within normal limits in size and contour. No evidence of central vascular congestion. No interlobular septal thickening. No pneumothorax or pleural effusion. Coarsened interstitial markings, with no confluent airspace disease. No acute displaced fracture.  Degenerative changes of the spine. IMPRESSION: No active cardiopulmonary disease.  CT Chest Over-Read CT Coronary 03/22/21: IMPRESSION: No significant extracardiac findings.   EKG: 03/30/21: SB at 45 bpm   CV: Carotid 05/30/21 04/01/21: Summary:  Right Carotid: The extracranial vessels were near-normal with only minimal wall thickening or plaque.  Left Carotid: The extracranial vessels were near-normal with only minimal wall thickening or plaque.    Echo 04/01/21: IMPRESSIONS  1. Left ventricular ejection fraction, by estimation, is 55 to 60%. The  left ventricle has normal function. The left ventricle has no regional  wall motion abnormalities. Left ventricular diastolic parameters are  consistent with Grade I diastolic  dysfunction (impaired relaxation).  2. Right ventricular systolic function is normal. The right ventricular  size is normal. There is normal pulmonary artery systolic pressure. The  estimated right ventricular systolic pressure is 23.1 mmHg.  3. The mitral valve is normal in structure. Mild mitral valve  regurgitation. No evidence of mitral stenosis.  4. The aortic valve has an indeterminant number of cusps. Aortic valve  regurgitation is not visualized. Mild aortic valve sclerosis is present,  with no evidence of  aortic valve stenosis.  5. The inferior vena cava is normal in size with greater than 50%  respiratory variability, suggesting right atrial pressure of 3 mmHg.    Cardiac cath 03/30/21 (done by Lance Muss, MD after abnormal coronary CT 03/22/21):   Prox LAD lesion is 90% stenosed.  1st Diag lesion is 75% stenosed.  Mid LAD lesion is 50% stenosed.  2nd Diag lesion is 75% stenosed.  Dist LAD lesion is 50% stenosed.  2nd Mrg lesion is 80% stenosed.  Prox Cx to Mid Cx lesion is 75% stenosed.  Ost Cx lesion is 50% stenosed.  RPDA lesion is 90% stenosed.  The left ventricular systolic function is normal.  LV end diastolic pressure is normal.  The left ventricular ejection fraction is 55-65% by visual estimate.  There is no aortic valve stenosis.   Cardiac surgery consult planned due to multivessel disease.    Patient not having any rest pain.  I stressed to him and his wife that he needs to avoid any exertion that brings on chest pressure.     Past Medical History:  Diagnosis Date  . Chest pain   . Fatigue   . Left shoulder pain     Past Surgical History:  Procedure Laterality Date  . LEFT HEART CATH AND CORONARY ANGIOGRAPHY N/A 03/30/2021   Procedure: LEFT HEART CATH AND CORONARY ANGIOGRAPHY;  Surgeon: Corky Crafts, MD;  Location: [facility] INVASIVE CV LAB;  Service: Cardiovascular;  Laterality: N/A;  . WIDSOM TOOTH REMOVAL      MEDICATIONS: . acetaminophen (TYLENOL) 500 MG tablet  . aspirin EC 81 MG tablet  . calamine lotion  . isosorbide mononitrate (IMDUR) 30 MG 24 hr tablet  . nitroGLYCERIN (NITROSTAT) 0.4 MG SL tablet   No current facility-administered medications for this encounter.    Shonna Chock, PA-C Surgical Short Stay/Anesthesiology [facility] Phone [phone] [facility] Phone [phone] 04/05/2021 4:31 PM

## 2021-04-06 ENCOUNTER — Encounter (HOSPITAL_COMMUNITY): Payer: Self-pay

## 2021-04-06 ENCOUNTER — Encounter (HOSPITAL_COMMUNITY)
Admission: RE | Admit: 2021-04-06 | Discharge: 2021-04-06 | Disposition: A | Discharge status: Home / Self Care | Payer: Federal, State, Local not specified - PPO | Source: Ambulatory Visit | Attending: Surgery | Admitting: Surgery

## 2021-04-06 ENCOUNTER — Other Ambulatory Visit: Payer: Self-pay

## 2021-04-06 ENCOUNTER — Ambulatory Visit (HOSPITAL_COMMUNITY)
Admission: RE | Admit: 2021-04-06 | Discharge: 2021-04-06 | Disposition: A | Discharge status: Home / Self Care | Payer: Federal, State, Local not specified - PPO | Source: Ambulatory Visit | Attending: Surgery | Admitting: Surgery

## 2021-04-06 DIAGNOSIS — L237 Allergic contact dermatitis due to plants, except food: Secondary | ICD-10-CM | POA: Insufficient documentation

## 2021-04-06 DIAGNOSIS — I2584 Coronary atherosclerosis due to calcified coronary lesion: Secondary | ICD-10-CM | POA: Insufficient documentation

## 2021-04-06 DIAGNOSIS — Z8249 Family history of ischemic heart disease and other diseases of the circulatory system: Secondary | ICD-10-CM | POA: Insufficient documentation

## 2021-04-06 DIAGNOSIS — Z01818 Encounter for other preprocedural examination: Secondary | ICD-10-CM | POA: Insufficient documentation

## 2021-04-06 DIAGNOSIS — R918 Other nonspecific abnormal finding of lung field: Secondary | ICD-10-CM | POA: Diagnosis not present

## 2021-04-06 DIAGNOSIS — D62 Acute posthemorrhagic anemia: Secondary | ICD-10-CM | POA: Diagnosis not present

## 2021-04-06 DIAGNOSIS — Z79899 Other long term (current) drug therapy: Secondary | ICD-10-CM | POA: Insufficient documentation

## 2021-04-06 DIAGNOSIS — Z7982 Long term (current) use of aspirin: Secondary | ICD-10-CM | POA: Insufficient documentation

## 2021-04-06 DIAGNOSIS — Z833 Family history of diabetes mellitus: Secondary | ICD-10-CM | POA: Diagnosis not present

## 2021-04-06 DIAGNOSIS — M47814 Spondylosis without myelopathy or radiculopathy, thoracic region: Secondary | ICD-10-CM | POA: Diagnosis not present

## 2021-04-06 DIAGNOSIS — I25118 Atherosclerotic heart disease of native coronary artery with other forms of angina pectoris: Secondary | ICD-10-CM | POA: Insufficient documentation

## 2021-04-06 DIAGNOSIS — I2510 Atherosclerotic heart disease of native coronary artery without angina pectoris: Secondary | ICD-10-CM

## 2021-04-06 DIAGNOSIS — Z823 Family history of stroke: Secondary | ICD-10-CM | POA: Diagnosis not present

## 2021-04-06 DIAGNOSIS — J9811 Atelectasis: Secondary | ICD-10-CM | POA: Diagnosis not present

## 2021-04-06 DIAGNOSIS — Z20822 Contact with and (suspected) exposure to covid-19: Secondary | ICD-10-CM | POA: Insufficient documentation

## 2021-04-06 DIAGNOSIS — I25119 Atherosclerotic heart disease of native coronary artery with unspecified angina pectoris: Secondary | ICD-10-CM | POA: Diagnosis not present

## 2021-04-06 DIAGNOSIS — I959 Hypotension, unspecified: Secondary | ICD-10-CM | POA: Diagnosis not present

## 2021-04-06 HISTORY — DX: Atherosclerotic heart disease of native coronary artery without angina pectoris: I25.10

## 2021-04-06 HISTORY — DX: Angina pectoris, unspecified: I20.9

## 2021-04-06 LAB — CBC
HCT: 43.6 % (ref 39.0–52.0)
Hemoglobin: 14.6 g/dL (ref 13.0–17.0)
MCH: 32.1 pg (ref 26.0–34.0)
MCHC: 33.5 g/dL (ref 30.0–36.0)
MCV: 95.8 fL (ref 80.0–100.0)
Platelets: 246 10*3/uL (ref 150–400)
RBC: 4.55 MIL/uL (ref 4.22–5.81)
RDW: 12.3 % (ref 11.5–15.5)
WBC: 6.3 10*3/uL (ref 4.0–10.5)
nRBC: 0 % (ref 0.0–0.2)

## 2021-04-06 LAB — BLOOD GAS, ARTERIAL
Acid-base deficit: 0.7 mmol/L (ref 0.0–2.0)
Bicarbonate: 23.4 mmol/L (ref 20.0–28.0)
FIO2: 21
O2 Saturation: 97.5 %
Patient temperature: 37
pCO2 arterial: 38.2 mmHg (ref 32.0–48.0)
pH, Arterial: 7.404 (ref 7.350–7.450)
pO2, Arterial: 95.7 mmHg (ref 83.0–108.0)

## 2021-04-06 LAB — HEMOGLOBIN A1C
Hgb A1c MFr Bld: 5.6 % (ref 4.8–5.6)
Mean Plasma Glucose: 114.02 mg/dL

## 2021-04-06 LAB — COMPREHENSIVE METABOLIC PANEL
ALT: 16 U/L (ref 0–44)
AST: 14 U/L — ABNORMAL LOW (ref 15–41)
Albumin: 3.8 g/dL (ref 3.5–5.0)
Alkaline Phosphatase: 60 U/L (ref 38–126)
Anion gap: 8 (ref 5–15)
BUN: 16 mg/dL (ref 8–23)
CO2: 22 mmol/L (ref 22–32)
Calcium: 9 mg/dL (ref 8.9–10.3)
Chloride: 106 mmol/L (ref 98–111)
Creatinine, Ser: 0.95 mg/dL (ref 0.61–1.24)
GFR, Estimated: >60 mL/min (ref >60)
Glucose, Bld: 102 mg/dL — ABNORMAL HIGH (ref 70–99)
Potassium: 4.5 mmol/L (ref 3.5–5.1)
Sodium: 136 mmol/L (ref 135–145)
Total Bilirubin: 0.8 mg/dL (ref 0.3–1.2)
Total Protein: 6.4 g/dL — ABNORMAL LOW (ref 6.5–8.1)

## 2021-04-06 LAB — PROTIME-INR
INR: 1 (ref 0.8–1.2)
Prothrombin Time: 13.6 seconds (ref 11.4–15.2)

## 2021-04-06 LAB — URINALYSIS, ROUTINE W REFLEX MICROSCOPIC
Bilirubin Urine: NEGATIVE
Glucose, UA: NEGATIVE mg/dL
Hgb urine dipstick: NEGATIVE
Ketones, ur: NEGATIVE mg/dL
Leukocytes,Ua: NEGATIVE
Nitrite: NEGATIVE
Protein, ur: NEGATIVE mg/dL
Specific Gravity, Urine: 1.028 (ref 1.005–1.030)
pH: 5 (ref 5.0–8.0)

## 2021-04-06 LAB — SURGICAL PCR SCREEN
MRSA, PCR: NEGATIVE
Staphylococcus aureus: NEGATIVE

## 2021-04-06 LAB — APTT: aPTT: 27 seconds (ref 24–36)

## 2021-04-06 LAB — SARS CORONAVIRUS 2 (TAT 6-24 HRS): SARS Coronavirus 2: NEGATIVE

## 2021-04-06 NOTE — Progress Notes (Addendum)
PCP - Kipp Brood C. Bryon Lions, [place] Cardiologist - Lance Muss, MD  PPM/ICD - Denies  Chest x-ray - 04/06/21 EKG - 03/30/21 Stress Test - ~ 20 years ago ECHO - 04/01/21 Cardiac Cath - 03/30/21  Sleep Study -  Denies   Patient denies being diabetic.  Blood Thinner Instructions: N/A Aspirin Instructions: Pt to verify with Dr. Laneta Simmers  ERAS Protcol - N/A PRE-SURGERY Ensure or G2- N/A  COVID TEST- 04/05/21   Anesthesia review: Yes, cardiac hx.  Patient denies shortness of breath, fever, cough and chest pain at PAT appointment   All instructions explained to the patient, with a verbal understanding of the material. Patient agrees to go over the instructions while at home for a better understanding. Patient also instructed to self quarantine after being tested for COVID-19. The opportunity to ask questions was provided.

## 2021-04-06 NOTE — Pre-Procedure Instructions (Signed)
Surgical Instructions:    Your procedure is scheduled on Thursday, May 19th (07:45 AM- 1:34 PM).  Report to [facility] Main Entrance "A" at 05:30 A.M., then check in with the Admitting office.  Call this number if you have any questions prior to, or have any problems the morning of surgery:  [phone]    Remember:  Do not eat or drink after midnight the night before your surgery.     Take these medicines the morning of surgery with A SIP OF WATER: isosorbide mononitrate (IMDUR)  IF NEEDED: acetaminophen (TYLENOL)  nitroGLYCERIN (NITROSTAT)   As of today, STOP taking any Aspirin (unless otherwise instructed by your surgeon) Aleve, Naproxen, Ibuprofen, Motrin, Advil, Goody's, BC's, all herbal medications, fish oil, and all vitamins.             Special instructions:   [place]- Preparing For Surgery  Before surgery, you can play an important role. Because skin is not sterile, your skin needs to be as free of germs as possible. You can reduce the number of germs on your skin by washing with CHG (chlorahexidine gluconate) Soap before surgery.  CHG is an antiseptic cleaner which kills germs and bonds with the skin to continue killing germs even after washing.    Oral Hygiene is also important to reduce your risk of infection.  Remember - BRUSH YOUR TEETH THE MORNING OF SURGERY WITH YOUR REGULAR TOOTHPASTE  Please do not use if you have an allergy to CHG or antibacterial soaps. If your skin becomes reddened/irritated stop using the CHG.  Do not shave (including legs and underarms) for at least 48 hours prior to first CHG shower. It is OK to shave your face.  Please follow these instructions carefully.   1. Shower the NIGHT BEFORE SURGERY and the MORNING OF SURGERY  2. If you chose to wash your hair, wash your hair first as usual with your normal shampoo.  3. After you shampoo, rinse your hair and body thoroughly to remove the shampoo.  4. Wash Face and genitals (private  parts) with your normal soap.   5. Use CHG Soap as you would any other liquid soap. You can apply CHG directly to the skin and wash gently with a scrungie or a clean washcloth.   6. Apply the CHG Soap to your body ONLY FROM THE NECK DOWN.  Do not use on open wounds or open sores. Avoid contact with your eyes, ears, mouth and genitals (private parts). Wash Face and genitals (private parts)  with your normal soap.   7. Wash thoroughly, paying special attention to the area where your surgery will be performed.  8. Thoroughly rinse your body with warm water from the neck down.  9. DO NOT shower/wash with your normal soap after using and rinsing off the CHG Soap.  10. Pat yourself dry with a CLEAN TOWEL.  11. Wear CLEAN PAJAMAS to bed the night before surgery.  12. Place CLEAN SHEETS on your bed the night before your surgery.  13. DO NOT SLEEP WITH PETS.   Day of Surgery: SHOWER with CHG soap. Brush your teeth WITH YOUR REGULAR TOOTHPASTE. Wear Clean/Comfortable clothing the morning of surgery. Do not apply any deodorants/lotions.   Do not wear jewelry, make up, or nail polish. Do not shave 48 hours prior to surgery.  Men may shave face and neck. Do not bring valuables to the hospital. [facility] is not responsible for any belongings or valuables.  If you use a  CPAP at night, you may bring all equipment for your overnight stay.   Do NOT Smoke (Tobacco/Vaping) or drink Alcohol 24 hours prior to your procedure.   Contacts, glasses, or dentures may not be worn into surgery, please bring cases for these belongings.   For patients admitted to the hospital, discharge time will be determined by your treatment team.   Patients discharged the day of surgery will not be allowed to drive home, and someone needs to stay with them for 24 hours.    Please read over the following fact sheets that you were given.

## 2021-04-06 NOTE — Progress Notes (Signed)
Pt stated he was exposed to poison ivy within the last week. Currently taking Calamine lotion and topical benadryl. No open areas, custing, or pustules observed. Darius Bump, RN with [facility] aware, who has also notified Shonna Chock, PA-C

## 2021-04-07 MED ORDER — PHENYLEPHRINE HCL-NACL 20-0.9 MG/250ML-% IV SOLN
30.0000 ug/min | INTRAVENOUS | Status: AC
  Administered 2021-04-08: 25 ug/min via INTRAVENOUS
  Filled 2021-04-07: qty 250

## 2021-04-07 MED ORDER — PLASMA-LYTE 148 IV SOLN
INTRAVENOUS | Status: DC
  Filled 2021-04-07: qty 2.5

## 2021-04-07 MED ORDER — MAGNESIUM SULFATE 50 % IJ SOLN
40.0000 meq | INTRAMUSCULAR | Status: DC
  Filled 2021-04-07: qty 9.85

## 2021-04-07 MED ORDER — INSULIN REGULAR(HUMAN) IN NACL 100-0.9 UT/100ML-% IV SOLN
INTRAVENOUS | Status: AC
  Administered 2021-04-08: .8 [IU]/h via INTRAVENOUS
  Filled 2021-04-07: qty 100

## 2021-04-07 MED ORDER — POTASSIUM CHLORIDE 2 MEQ/ML IV SOLN
80.0000 meq | INTRAVENOUS | Status: DC
  Filled 2021-04-07: qty 40

## 2021-04-07 MED ORDER — VANCOMYCIN HCL 1250 MG/250ML IV SOLN
1250.0000 mg | INTRAVENOUS | Status: AC
  Administered 2021-04-08: 1250 mg via INTRAVENOUS
  Filled 2021-04-07: qty 250

## 2021-04-07 MED ORDER — TRANEXAMIC ACID (OHS) BOLUS VIA INFUSION
15.0000 mg/kg | INTRAVENOUS | Status: AC
  Administered 2021-04-08: 1165.5 mg via INTRAVENOUS
  Filled 2021-04-07: qty 1166

## 2021-04-07 MED ORDER — CEFAZOLIN SODIUM-DEXTROSE 2-4 GM/100ML-% IV SOLN
2.0000 g | INTRAVENOUS | Status: AC
  Administered 2021-04-08: 2 g via INTRAVENOUS
  Filled 2021-04-07: qty 100

## 2021-04-07 MED ORDER — NITROGLYCERIN IN D5W 200-5 MCG/ML-% IV SOLN
2.0000 ug/min | INTRAVENOUS | Status: DC
  Filled 2021-04-07: qty 250

## 2021-04-07 MED ORDER — EPINEPHRINE HCL 5 MG/250ML IV SOLN IN NS
0.0000 ug/min | INTRAVENOUS | Status: DC
  Filled 2021-04-07: qty 250

## 2021-04-07 MED ORDER — TRANEXAMIC ACID (OHS) PUMP PRIME SOLUTION
2.0000 mg/kg | INTRAVENOUS | Status: DC
  Filled 2021-04-07: qty 1.55

## 2021-04-07 MED ORDER — TRANEXAMIC ACID 1000 MG/10ML IV SOLN
1.5000 mg/kg/h | INTRAVENOUS | Status: AC
  Administered 2021-04-08: 1.5 mg/kg/h via INTRAVENOUS
  Filled 2021-04-07: qty 25

## 2021-04-07 MED ORDER — MILRINONE LACTATE IN DEXTROSE 20-5 MG/100ML-% IV SOLN
0.3000 ug/kg/min | INTRAVENOUS | Status: DC
  Filled 2021-04-07: qty 100

## 2021-04-07 MED ORDER — DEXMEDETOMIDINE HCL IN NACL 400 MCG/100ML IV SOLN
0.1000 ug/kg/h | INTRAVENOUS | Status: AC
  Administered 2021-04-08: .3 ug/kg/h via INTRAVENOUS
  Filled 2021-04-07: qty 100

## 2021-04-07 MED ORDER — NOREPINEPHRINE 4 MG/250ML-% IV SOLN
0.0000 ug/min | INTRAVENOUS | Status: DC
Start: 2021-04-08 — End: 2021-04-08
  Filled 2021-04-07: qty 250

## 2021-04-07 MED ORDER — SODIUM CHLORIDE 0.9 % IV SOLN
INTRAVENOUS | Status: DC
  Filled 2021-04-07: qty 30

## 2021-04-07 NOTE — Anesthesia Preprocedure Evaluation (Addendum)
Anesthesia Evaluation  Patient identified by MRN, date of birth, ID band Patient awake    Reviewed: Allergy & Precautions, NPO status , Patient's Chart, lab work & pertinent test results  Airway Mallampati: II  TM Distance: >3 FB Neck ROM: Full    Dental  (+) Chipped,    Pulmonary neg pulmonary ROS,    Pulmonary exam normal        Cardiovascular hypertension, Pt. on medications + angina + CAD   Rhythm:Regular Rate:Bradycardia     Neuro/Psych negative neurological ROS     GI/Hepatic negative GI ROS, Neg liver ROS,   Endo/Other  negative endocrine ROS  Renal/GU negative Renal ROS     Musculoskeletal   Abdominal   Peds  Hematology   Anesthesia Other Findings   Reproductive/Obstetrics                             1. Left ventricular ejection fraction, by estimation, is 55 to 60%. The left ventricle has normal function. The left ventricle has no regional wall motion abnormalities. Left ventricular diastolic parameters are consistent with Grade I diastolic   dysfunction (impaired relaxation).  2. Right ventricular systolic function is normal. The right ventricular size is normal. There is normal pulmonary artery systolic pressure. The estimated right ventricular systolic pressure is 23.1 mmHg.  3. The mitral valve is normal in structure. Mild mitral valve regurgitation. No evidence of mitral stenosis.  4. The aortic valve has an indeterminant number of cusps. Aortic valve regurgitation is not visualized. Mild aortic valve sclerosis is present, with no evidence of aortic valve stenosis. 5. The inferior vena cava is normal in size with greater than 50% respiratory variability, suggesting right atrial pressure of 3 mmHg.  Lab Results  Component Value Date   WBC 6.3 04/06/2021   HGB 14.6 04/06/2021   HCT 43.6 04/06/2021   MCV 95.8 04/06/2021   PLT 246 04/06/2021   Lab Results  Component Value  Date   CREATININE 0.95 04/06/2021   BUN 16 04/06/2021   NA 136 04/06/2021   K 4.5 04/06/2021   CL 106 04/06/2021   CO2 22 04/06/2021    Anesthesia Physical Anesthesia Plan  ASA: IV  Anesthesia Plan: General   Post-op Pain Management:    Induction: Intravenous  PONV Risk Score and Plan: 2 and Dexamethasone, Ondansetron and Treatment may vary due to age or medical condition  Airway Management Planned: Oral ETT  Additional Equipment: Arterial line, CVP, PA Cath, TEE and Ultrasound Guidance Line Placement  Intra-op Plan:   Post-operative Plan: Post-operative intubation/ventilation  Informed Consent: I have reviewed the patients History and Physical, chart, labs and discussed the procedure including the risks, benefits and alternatives for the proposed anesthesia with the patient or authorized representative who has indicated his/her understanding and acceptance.       Plan Discussed with: Anesthesiologist and CRNA  Anesthesia Plan Comments: ( )    Anesthesia Quick Evaluation

## 2021-04-08 ENCOUNTER — Inpatient Hospital Stay (HOSPITAL_COMMUNITY): Payer: Federal, State, Local not specified - PPO

## 2021-04-08 ENCOUNTER — Inpatient Hospital Stay (HOSPITAL_COMMUNITY)
Admission: RE | Admit: 2021-04-08 | Discharge: 2021-04-13 | DRG: 236 | Disposition: A | Discharge status: Home / Self Care | Payer: Federal, State, Local not specified - PPO | Source: Ambulatory Visit | Attending: Surgery | Admitting: Surgery

## 2021-04-08 ENCOUNTER — Inpatient Hospital Stay (HOSPITAL_COMMUNITY)
Admission: RE | Disposition: A | Discharge status: Home / Self Care | Payer: Self-pay | Source: Ambulatory Visit | Attending: Surgery

## 2021-04-08 ENCOUNTER — Other Ambulatory Visit: Payer: Self-pay

## 2021-04-08 ENCOUNTER — Inpatient Hospital Stay (HOSPITAL_COMMUNITY): Payer: Federal, State, Local not specified - PPO | Admitting: Vascular Surgery

## 2021-04-08 ENCOUNTER — Inpatient Hospital Stay (HOSPITAL_COMMUNITY): Payer: Federal, State, Local not specified - PPO | Admitting: Certified Registered"

## 2021-04-08 ENCOUNTER — Encounter (HOSPITAL_COMMUNITY): Payer: Self-pay | Admitting: Surgery

## 2021-04-08 DIAGNOSIS — Z79899 Other long term (current) drug therapy: Secondary | ICD-10-CM | POA: Diagnosis not present

## 2021-04-08 DIAGNOSIS — I25119 Atherosclerotic heart disease of native coronary artery with unspecified angina pectoris: Secondary | ICD-10-CM | POA: Diagnosis not present

## 2021-04-08 DIAGNOSIS — Z833 Family history of diabetes mellitus: Secondary | ICD-10-CM | POA: Diagnosis not present

## 2021-04-08 DIAGNOSIS — D62 Acute posthemorrhagic anemia: Secondary | ICD-10-CM | POA: Diagnosis not present

## 2021-04-08 DIAGNOSIS — I2510 Atherosclerotic heart disease of native coronary artery without angina pectoris: Secondary | ICD-10-CM

## 2021-04-08 DIAGNOSIS — Z823 Family history of stroke: Secondary | ICD-10-CM | POA: Diagnosis not present

## 2021-04-08 DIAGNOSIS — Z7982 Long term (current) use of aspirin: Secondary | ICD-10-CM | POA: Diagnosis not present

## 2021-04-08 DIAGNOSIS — J9811 Atelectasis: Secondary | ICD-10-CM | POA: Diagnosis not present

## 2021-04-08 DIAGNOSIS — Z20822 Contact with and (suspected) exposure to covid-19: Secondary | ICD-10-CM | POA: Diagnosis present

## 2021-04-08 DIAGNOSIS — Z951 Presence of aortocoronary bypass graft: Secondary | ICD-10-CM | POA: Diagnosis not present

## 2021-04-08 DIAGNOSIS — I10 Essential (primary) hypertension: Secondary | ICD-10-CM | POA: Diagnosis not present

## 2021-04-08 DIAGNOSIS — Z8249 Family history of ischemic heart disease and other diseases of the circulatory system: Secondary | ICD-10-CM | POA: Diagnosis not present

## 2021-04-08 DIAGNOSIS — I081 Rheumatic disorders of both mitral and tricuspid valves: Secondary | ICD-10-CM | POA: Diagnosis not present

## 2021-04-08 DIAGNOSIS — J90 Pleural effusion, not elsewhere classified: Secondary | ICD-10-CM | POA: Diagnosis not present

## 2021-04-08 DIAGNOSIS — I959 Hypotension, unspecified: Secondary | ICD-10-CM | POA: Diagnosis not present

## 2021-04-08 DIAGNOSIS — J984 Other disorders of lung: Secondary | ICD-10-CM | POA: Diagnosis not present

## 2021-04-08 HISTORY — PX: TEE WITHOUT CARDIOVERSION: SHX5443

## 2021-04-08 HISTORY — PX: CORONARY ARTERY BYPASS GRAFT: SHX141

## 2021-04-08 LAB — POCT I-STAT EG7
Acid-Base Excess: 1 mmol/L (ref 0.0–2.0)
Bicarbonate: 26.6 mmol/L (ref 20.0–28.0)
Calcium, Ion: 1.05 mmol/L — ABNORMAL LOW (ref 1.15–1.40)
HCT: 29 % — ABNORMAL LOW (ref 39.0–52.0)
Hemoglobin: 9.9 g/dL — ABNORMAL LOW (ref 13.0–17.0)
O2 Saturation: 91 %
Potassium: 5.2 mmol/L — ABNORMAL HIGH (ref 3.5–5.1)
Sodium: 139 mmol/L (ref 135–145)
TCO2: 28 mmol/L (ref 22–32)
pCO2, Ven: 47.7 mmHg (ref 44.0–60.0)
pH, Ven: 7.354 (ref 7.250–7.430)
pO2, Ven: 64 mmHg — ABNORMAL HIGH (ref 32.0–45.0)

## 2021-04-08 LAB — POCT I-STAT 7, (LYTES, BLD GAS, ICA,H+H)
Acid-Base Excess: 0 mmol/L (ref 0.0–2.0)
Acid-Base Excess: 2 mmol/L (ref 0.0–2.0)
Acid-Base Excess: 1 mmol/L (ref 0.0–2.0)
Acid-Base Excess: 2 mmol/L (ref 0.0–2.0)
Acid-Base Excess: 1 mmol/L (ref 0.0–2.0)
Acid-base deficit: 2 mmol/L (ref 0.0–2.0)
Acid-base deficit: 3 mmol/L — ABNORMAL HIGH (ref 0.0–2.0)
Acid-base deficit: 2 mmol/L (ref 0.0–2.0)
Acid-base deficit: 3 mmol/L — ABNORMAL HIGH (ref 0.0–2.0)
Bicarbonate: 24.7 mmol/L (ref 20.0–28.0)
Bicarbonate: 27.8 mmol/L (ref 20.0–28.0)
Bicarbonate: 25.3 mmol/L (ref 20.0–28.0)
Bicarbonate: 25.6 mmol/L (ref 20.0–28.0)
Bicarbonate: 25.8 mmol/L (ref 20.0–28.0)
Bicarbonate: 22.7 mmol/L (ref 20.0–28.0)
Bicarbonate: 20.6 mmol/L (ref 20.0–28.0)
Bicarbonate: 22.9 mmol/L (ref 20.0–28.0)
Bicarbonate: 22.1 mmol/L (ref 20.0–28.0)
Calcium, Ion: 1.01 mmol/L — ABNORMAL LOW (ref 1.15–1.40)
Calcium, Ion: 1.08 mmol/L — ABNORMAL LOW (ref 1.15–1.40)
Calcium, Ion: 1.04 mmol/L — ABNORMAL LOW (ref 1.15–1.40)
Calcium, Ion: 1.06 mmol/L — ABNORMAL LOW (ref 1.15–1.40)
Calcium, Ion: 1.08 mmol/L — ABNORMAL LOW (ref 1.15–1.40)
Calcium, Ion: 1.08 mmol/L — ABNORMAL LOW (ref 1.15–1.40)
Calcium, Ion: 1.09 mmol/L — ABNORMAL LOW (ref 1.15–1.40)
Calcium, Ion: 1.06 mmol/L — ABNORMAL LOW (ref 1.15–1.40)
Calcium, Ion: 1.09 mmol/L — ABNORMAL LOW (ref 1.15–1.40)
HCT: 28 % — ABNORMAL LOW (ref 39.0–52.0)
HCT: 34 % — ABNORMAL LOW (ref 39.0–52.0)
HCT: 29 % — ABNORMAL LOW (ref 39.0–52.0)
HCT: 30 % — ABNORMAL LOW (ref 39.0–52.0)
HCT: 31 % — ABNORMAL LOW (ref 39.0–52.0)
HCT: 31 % — ABNORMAL LOW (ref 39.0–52.0)
HCT: 28 % — ABNORMAL LOW (ref 39.0–52.0)
HCT: 24 % — ABNORMAL LOW (ref 39.0–52.0)
HCT: 24 % — ABNORMAL LOW (ref 39.0–52.0)
Hemoglobin: 11.6 g/dL — ABNORMAL LOW (ref 13.0–17.0)
Hemoglobin: 9.5 g/dL — ABNORMAL LOW (ref 13.0–17.0)
Hemoglobin: 10.2 g/dL — ABNORMAL LOW (ref 13.0–17.0)
Hemoglobin: 9.9 g/dL — ABNORMAL LOW (ref 13.0–17.0)
Hemoglobin: 10.5 g/dL — ABNORMAL LOW (ref 13.0–17.0)
Hemoglobin: 10.5 g/dL — ABNORMAL LOW (ref 13.0–17.0)
Hemoglobin: 9.5 g/dL — ABNORMAL LOW (ref 13.0–17.0)
Hemoglobin: 8.2 g/dL — ABNORMAL LOW (ref 13.0–17.0)
Hemoglobin: 8.2 g/dL — ABNORMAL LOW (ref 13.0–17.0)
O2 Saturation: 100 %
O2 Saturation: 100 %
O2 Saturation: 100 %
O2 Saturation: 100 %
O2 Saturation: 100 %
O2 Saturation: 100 %
O2 Saturation: 99 %
O2 Saturation: 99 %
O2 Saturation: 99 %
Patient temperature: 35.8
Patient temperature: 36.5
Patient temperature: 37
Patient temperature: 37.2
Potassium: 4.6 mmol/L (ref 3.5–5.1)
Potassium: 4.7 mmol/L (ref 3.5–5.1)
Potassium: 4.6 mmol/L (ref 3.5–5.1)
Potassium: 5.1 mmol/L (ref 3.5–5.1)
Potassium: 4.5 mmol/L (ref 3.5–5.1)
Potassium: 3.6 mmol/L (ref 3.5–5.1)
Potassium: 4.4 mmol/L (ref 3.5–5.1)
Potassium: 3.5 mmol/L (ref 3.5–5.1)
Potassium: 3.8 mmol/L (ref 3.5–5.1)
Sodium: 138 mmol/L (ref 135–145)
Sodium: 139 mmol/L (ref 135–145)
Sodium: 136 mmol/L (ref 135–145)
Sodium: 137 mmol/L (ref 135–145)
Sodium: 138 mmol/L (ref 135–145)
Sodium: 139 mmol/L (ref 135–145)
Sodium: 137 mmol/L (ref 135–145)
Sodium: 138 mmol/L (ref 135–145)
Sodium: 139 mmol/L (ref 135–145)
TCO2: 26 mmol/L (ref 22–32)
TCO2: 29 mmol/L (ref 22–32)
TCO2: 26 mmol/L (ref 22–32)
TCO2: 27 mmol/L (ref 22–32)
TCO2: 27 mmol/L (ref 22–32)
TCO2: 24 mmol/L (ref 22–32)
TCO2: 22 mmol/L (ref 22–32)
TCO2: 24 mmol/L (ref 22–32)
TCO2: 23 mmol/L (ref 22–32)
pCO2 arterial: 38.4 mmHg (ref 32.0–48.0)
pCO2 arterial: 49.4 mmHg — ABNORMAL HIGH (ref 32.0–48.0)
pCO2 arterial: 36.4 mmHg (ref 32.0–48.0)
pCO2 arterial: 36.8 mmHg (ref 32.0–48.0)
pCO2 arterial: 39.6 mmHg (ref 32.0–48.0)
pCO2 arterial: 35.3 mmHg (ref 32.0–48.0)
pCO2 arterial: 30.9 mmHg — ABNORMAL LOW (ref 32.0–48.0)
pCO2 arterial: 36.2 mmHg (ref 32.0–48.0)
pCO2 arterial: 38.3 mmHg (ref 32.0–48.0)
pH, Arterial: 7.359 (ref 7.350–7.450)
pH, Arterial: 7.416 (ref 7.350–7.450)
pH, Arterial: 7.45 (ref 7.350–7.450)
pH, Arterial: 7.45 (ref 7.350–7.450)
pH, Arterial: 7.423 (ref 7.350–7.450)
pH, Arterial: 7.412 (ref 7.350–7.450)
pH, Arterial: 7.43 (ref 7.350–7.450)
pH, Arterial: 7.409 (ref 7.350–7.450)
pH, Arterial: 7.371 (ref 7.350–7.450)
pO2, Arterial: 378 mmHg — ABNORMAL HIGH (ref 83.0–108.0)
pO2, Arterial: 388 mmHg — ABNORMAL HIGH (ref 83.0–108.0)
pO2, Arterial: 350 mmHg — ABNORMAL HIGH (ref 83.0–108.0)
pO2, Arterial: 355 mmHg — ABNORMAL HIGH (ref 83.0–108.0)
pO2, Arterial: 276 mmHg — ABNORMAL HIGH (ref 83.0–108.0)
pO2, Arterial: 163 mmHg — ABNORMAL HIGH (ref 83.0–108.0)
pO2, Arterial: 139 mmHg — ABNORMAL HIGH (ref 83.0–108.0)
pO2, Arterial: 128 mmHg — ABNORMAL HIGH (ref 83.0–108.0)
pO2, Arterial: 126 mmHg — ABNORMAL HIGH (ref 83.0–108.0)

## 2021-04-08 LAB — CBC
HCT: 32.6 % — ABNORMAL LOW (ref 39.0–52.0)
HCT: 27.2 % — ABNORMAL LOW (ref 39.0–52.0)
Hemoglobin: 11.1 g/dL — ABNORMAL LOW (ref 13.0–17.0)
Hemoglobin: 9.3 g/dL — ABNORMAL LOW (ref 13.0–17.0)
MCH: 32.6 pg (ref 26.0–34.0)
MCH: 32.5 pg (ref 26.0–34.0)
MCHC: 34 g/dL (ref 30.0–36.0)
MCHC: 34.2 g/dL (ref 30.0–36.0)
MCV: 95.9 fL (ref 80.0–100.0)
MCV: 95.1 fL (ref 80.0–100.0)
Platelets: 129 10*3/uL — ABNORMAL LOW (ref 150–400)
Platelets: 143 10*3/uL — ABNORMAL LOW (ref 150–400)
RBC: 3.4 MIL/uL — ABNORMAL LOW (ref 4.22–5.81)
RBC: 2.86 MIL/uL — ABNORMAL LOW (ref 4.22–5.81)
RDW: 11.9 % (ref 11.5–15.5)
RDW: 11.9 % (ref 11.5–15.5)
WBC: 13.7 10*3/uL — ABNORMAL HIGH (ref 4.0–10.5)
WBC: 12.9 10*3/uL — ABNORMAL HIGH (ref 4.0–10.5)
nRBC: 0 % (ref 0.0–0.2)
nRBC: 0 % (ref 0.0–0.2)

## 2021-04-08 LAB — GLUCOSE, CAPILLARY
Glucose-Capillary: 112 mg/dL — ABNORMAL HIGH (ref 70–99)
Glucose-Capillary: 125 mg/dL — ABNORMAL HIGH (ref 70–99)
Glucose-Capillary: 114 mg/dL — ABNORMAL HIGH (ref 70–99)
Glucose-Capillary: 141 mg/dL — ABNORMAL HIGH (ref 70–99)
Glucose-Capillary: 149 mg/dL — ABNORMAL HIGH (ref 70–99)
Glucose-Capillary: 156 mg/dL — ABNORMAL HIGH (ref 70–99)
Glucose-Capillary: 198 mg/dL — ABNORMAL HIGH (ref 70–99)
Glucose-Capillary: 163 mg/dL — ABNORMAL HIGH (ref 70–99)
Glucose-Capillary: 146 mg/dL — ABNORMAL HIGH (ref 70–99)

## 2021-04-08 LAB — POCT I-STAT, CHEM 8
BUN: 14 mg/dL (ref 8–23)
BUN: 15 mg/dL (ref 8–23)
BUN: 15 mg/dL (ref 8–23)
BUN: 14 mg/dL (ref 8–23)
BUN: 13 mg/dL (ref 8–23)
BUN: 13 mg/dL (ref 8–23)
Calcium, Ion: 1.25 mmol/L (ref 1.15–1.40)
Calcium, Ion: 1.22 mmol/L (ref 1.15–1.40)
Calcium, Ion: 1.26 mmol/L (ref 1.15–1.40)
Calcium, Ion: 1.08 mmol/L — ABNORMAL LOW (ref 1.15–1.40)
Calcium, Ion: 1.04 mmol/L — ABNORMAL LOW (ref 1.15–1.40)
Calcium, Ion: 1.09 mmol/L — ABNORMAL LOW (ref 1.15–1.40)
Chloride: 104 mmol/L (ref 98–111)
Chloride: 103 mmol/L (ref 98–111)
Chloride: 104 mmol/L (ref 98–111)
Chloride: 102 mmol/L (ref 98–111)
Chloride: 102 mmol/L (ref 98–111)
Chloride: 101 mmol/L (ref 98–111)
Creatinine, Ser: 0.9 mg/dL (ref 0.61–1.24)
Creatinine, Ser: 0.8 mg/dL (ref 0.61–1.24)
Creatinine, Ser: 0.9 mg/dL (ref 0.61–1.24)
Creatinine, Ser: 0.7 mg/dL (ref 0.61–1.24)
Creatinine, Ser: 0.7 mg/dL (ref 0.61–1.24)
Creatinine, Ser: 0.7 mg/dL (ref 0.61–1.24)
Glucose, Bld: 92 mg/dL (ref 70–99)
Glucose, Bld: 108 mg/dL — ABNORMAL HIGH (ref 70–99)
Glucose, Bld: 113 mg/dL — ABNORMAL HIGH (ref 70–99)
Glucose, Bld: 111 mg/dL — ABNORMAL HIGH (ref 70–99)
Glucose, Bld: 123 mg/dL — ABNORMAL HIGH (ref 70–99)
Glucose, Bld: 142 mg/dL — ABNORMAL HIGH (ref 70–99)
HCT: 38 % — ABNORMAL LOW (ref 39.0–52.0)
HCT: 35 % — ABNORMAL LOW (ref 39.0–52.0)
HCT: 35 % — ABNORMAL LOW (ref 39.0–52.0)
HCT: 31 % — ABNORMAL LOW (ref 39.0–52.0)
HCT: 31 % — ABNORMAL LOW (ref 39.0–52.0)
HCT: 29 % — ABNORMAL LOW (ref 39.0–52.0)
Hemoglobin: 12.9 g/dL — ABNORMAL LOW (ref 13.0–17.0)
Hemoglobin: 11.9 g/dL — ABNORMAL LOW (ref 13.0–17.0)
Hemoglobin: 11.9 g/dL — ABNORMAL LOW (ref 13.0–17.0)
Hemoglobin: 10.5 g/dL — ABNORMAL LOW (ref 13.0–17.0)
Hemoglobin: 10.5 g/dL — ABNORMAL LOW (ref 13.0–17.0)
Hemoglobin: 9.9 g/dL — ABNORMAL LOW (ref 13.0–17.0)
Potassium: 4 mmol/L (ref 3.5–5.1)
Potassium: 4.1 mmol/L (ref 3.5–5.1)
Potassium: 4.3 mmol/L (ref 3.5–5.1)
Potassium: 4.5 mmol/L (ref 3.5–5.1)
Potassium: 5 mmol/L (ref 3.5–5.1)
Potassium: 4.2 mmol/L (ref 3.5–5.1)
Sodium: 139 mmol/L (ref 135–145)
Sodium: 138 mmol/L (ref 135–145)
Sodium: 138 mmol/L (ref 135–145)
Sodium: 138 mmol/L (ref 135–145)
Sodium: 136 mmol/L (ref 135–145)
Sodium: 136 mmol/L (ref 135–145)
TCO2: 24 mmol/L (ref 22–32)
TCO2: 26 mmol/L (ref 22–32)
TCO2: 28 mmol/L (ref 22–32)
TCO2: 25 mmol/L (ref 22–32)
TCO2: 26 mmol/L (ref 22–32)
TCO2: 23 mmol/L (ref 22–32)

## 2021-04-08 LAB — PLATELET COUNT: Platelets: 149 10*3/uL — ABNORMAL LOW (ref 150–400)

## 2021-04-08 LAB — BASIC METABOLIC PANEL
Anion gap: 5 (ref 5–15)
BUN: 11 mg/dL (ref 8–23)
CO2: 22 mmol/L (ref 22–32)
Calcium: 7.3 mg/dL — ABNORMAL LOW (ref 8.9–10.3)
Chloride: 107 mmol/L (ref 98–111)
Creatinine, Ser: 0.9 mg/dL (ref 0.61–1.24)
GFR, Estimated: >60 mL/min (ref >60)
Glucose, Bld: 146 mg/dL — ABNORMAL HIGH (ref 70–99)
Potassium: 3.6 mmol/L (ref 3.5–5.1)
Sodium: 134 mmol/L — ABNORMAL LOW (ref 135–145)

## 2021-04-08 LAB — APTT: aPTT: 32 seconds (ref 24–36)

## 2021-04-08 LAB — ECHO INTRAOPERATIVE TEE
Height: 69 in
Weight: 2740.76 oz

## 2021-04-08 LAB — ABO/RH: ABO/RH(D): B POS

## 2021-04-08 LAB — PROTIME-INR
INR: 1.5 — ABNORMAL HIGH (ref 0.8–1.2)
Prothrombin Time: 17.7 seconds — ABNORMAL HIGH (ref 11.4–15.2)

## 2021-04-08 LAB — MAGNESIUM: Magnesium: 3 mg/dL — ABNORMAL HIGH (ref 1.7–2.4)

## 2021-04-08 LAB — HEMOGLOBIN AND HEMATOCRIT, BLOOD
HCT: 29.3 % — ABNORMAL LOW (ref 39.0–52.0)
Hemoglobin: 10 g/dL — ABNORMAL LOW (ref 13.0–17.0)

## 2021-04-08 SURGERY — CORONARY ARTERY BYPASS GRAFTING (CABG)
Anesthesia: General | Site: Chest

## 2021-04-08 MED ORDER — FAMOTIDINE IN NACL 20-0.9 MG/50ML-% IV SOLN
20.0000 mg | Freq: Two times a day (BID) | INTRAVENOUS | Status: AC
  Administered 2021-04-08 (×2): 20 mg via INTRAVENOUS
  Filled 2021-04-08 (×2): qty 50

## 2021-04-08 MED ORDER — LACTATED RINGERS IV SOLN: INTRAVENOUS | Status: DC | PRN

## 2021-04-08 MED ORDER — PROTAMINE SULFATE 10 MG/ML IV SOLN
INTRAVENOUS | Status: AC
  Filled 2021-04-08: qty 25

## 2021-04-08 MED ORDER — ACETAMINOPHEN 160 MG/5ML PO SOLN
1000.0000 mg | Freq: Four times a day (QID) | ORAL | Status: DC
  Filled 2021-04-08: qty 40.6

## 2021-04-08 MED ORDER — SODIUM CHLORIDE 0.9% FLUSH
10.0000 mL | Freq: Two times a day (BID) | INTRAVENOUS | Status: DC
  Administered 2021-04-08: 20 mL
  Administered 2021-04-09 – 2021-04-10 (×3): 10 mL

## 2021-04-08 MED ORDER — SODIUM CHLORIDE 0.9 % IV SOLN: INTRAVENOUS | Status: DC

## 2021-04-08 MED ORDER — ROCURONIUM BROMIDE 10 MG/ML (PF) SYRINGE
PREFILLED_SYRINGE | INTRAVENOUS | Status: DC | PRN
  Administered 2021-04-08: 50 mg via INTRAVENOUS
  Administered 2021-04-08: 30 mg via INTRAVENOUS
  Administered 2021-04-08: 50 mg via INTRAVENOUS
  Administered 2021-04-08: 70 mg via INTRAVENOUS

## 2021-04-08 MED ORDER — ~~LOC~~ CARDIAC SURGERY, PATIENT & FAMILY EDUCATION
Freq: Once | Status: DC
  Filled 2021-04-08: qty 1

## 2021-04-08 MED ORDER — ROCURONIUM BROMIDE 10 MG/ML (PF) SYRINGE
PREFILLED_SYRINGE | INTRAVENOUS | Status: AC
  Filled 2021-04-08: qty 10

## 2021-04-08 MED ORDER — ALBUMIN HUMAN 5 % IV SOLN
250.0000 mL | INTRAVENOUS | Status: DC | PRN
  Administered 2021-04-08 (×3): 12.5 g via INTRAVENOUS
  Filled 2021-04-08: qty 250

## 2021-04-08 MED ORDER — FENTANYL CITRATE (PF) 250 MCG/5ML IJ SOLN
INTRAMUSCULAR | Status: AC
  Filled 2021-04-08: qty 20

## 2021-04-08 MED ORDER — METOPROLOL TARTRATE 12.5 MG HALF TABLET: 12.5000 mg | ORAL_TABLET | Freq: Two times a day (BID) | ORAL | Status: DC

## 2021-04-08 MED ORDER — HEMOSTATIC AGENTS (NO CHARGE) OPTIME
TOPICAL | Status: DC | PRN
  Administered 2021-04-08: 3 via TOPICAL

## 2021-04-08 MED ORDER — NITROGLYCERIN IN D5W 200-5 MCG/ML-% IV SOLN: 0.0000 ug/min | INTRAVENOUS | Status: DC

## 2021-04-08 MED ORDER — CHLORHEXIDINE GLUCONATE 0.12 % MT SOLN
15.0000 mL | OROMUCOSAL | Status: AC
  Administered 2021-04-08: 15 mL via OROMUCOSAL

## 2021-04-08 MED ORDER — FENTANYL CITRATE (PF) 250 MCG/5ML IJ SOLN
INTRAMUSCULAR | Status: AC
  Filled 2021-04-08: qty 5

## 2021-04-08 MED ORDER — SODIUM CHLORIDE 0.9% IV SOLUTION: Freq: Once | INTRAVENOUS | Status: AC

## 2021-04-08 MED ORDER — SODIUM CHLORIDE 0.9 % IV SOLN: 250.0000 mL | INTRAVENOUS | Status: DC

## 2021-04-08 MED ORDER — ONDANSETRON HCL 4 MG/2ML IJ SOLN
4.0000 mg | Freq: Four times a day (QID) | INTRAMUSCULAR | Status: DC | PRN
  Administered 2021-04-08 – 2021-04-09 (×2): 4 mg via INTRAVENOUS
  Filled 2021-04-08 (×3): qty 2

## 2021-04-08 MED ORDER — PANTOPRAZOLE SODIUM 40 MG PO TBEC
40.0000 mg | DELAYED_RELEASE_TABLET | Freq: Every day | ORAL | Status: DC
  Administered 2021-04-10: 40 mg via ORAL
  Filled 2021-04-08: qty 1

## 2021-04-08 MED ORDER — MIDAZOLAM HCL 2 MG/2ML IJ SOLN: 2.0000 mg | INTRAMUSCULAR | Status: DC | PRN

## 2021-04-08 MED ORDER — CHLORHEXIDINE GLUCONATE 0.12 % MT SOLN
OROMUCOSAL | Status: AC
  Administered 2021-04-08: 15 mL via OROMUCOSAL
  Filled 2021-04-08: qty 15

## 2021-04-08 MED ORDER — CEFAZOLIN SODIUM-DEXTROSE 2-4 GM/100ML-% IV SOLN
2.0000 g | Freq: Three times a day (TID) | INTRAVENOUS | Status: AC
  Administered 2021-04-08 – 2021-04-10 (×6): 2 g via INTRAVENOUS
  Filled 2021-04-08 (×6): qty 100

## 2021-04-08 MED ORDER — LACTATED RINGERS IV SOLN: 500.0000 mL | Freq: Once | INTRAVENOUS | Status: DC | PRN

## 2021-04-08 MED ORDER — ASPIRIN 81 MG PO CHEW
324.0000 mg | CHEWABLE_TABLET | Freq: Every day | ORAL | Status: DC
  Filled 2021-04-08: qty 4

## 2021-04-08 MED ORDER — PHENYLEPHRINE HCL-NACL 20-0.9 MG/250ML-% IV SOLN
0.0000 ug/min | INTRAVENOUS | Status: DC
Start: 2021-04-08 — End: 2021-04-10

## 2021-04-08 MED ORDER — CHLORHEXIDINE GLUCONATE 0.12 % MT SOLN: 15.0000 mL | Freq: Once | OROMUCOSAL | Status: AC

## 2021-04-08 MED ORDER — SODIUM CHLORIDE 0.45 % IV SOLN: INTRAVENOUS | Status: DC | PRN

## 2021-04-08 MED ORDER — HEPARIN SODIUM (PORCINE) 1000 UNIT/ML IJ SOLN
INTRAMUSCULAR | Status: DC | PRN
  Administered 2021-04-08: 24000 [IU] via INTRAVENOUS

## 2021-04-08 MED ORDER — ARTIFICIAL TEARS OPHTHALMIC OINT
TOPICAL_OINTMENT | OPHTHALMIC | Status: AC
  Filled 2021-04-08: qty 3.5

## 2021-04-08 MED ORDER — PLASMA-LYTE 148 IV SOLN
INTRAVENOUS | Status: DC | PRN
  Administered 2021-04-08: 500 mL

## 2021-04-08 MED ORDER — SODIUM CHLORIDE (PF) 0.9 % IJ SOLN
INTRAMUSCULAR | Status: AC
  Filled 2021-04-08: qty 10

## 2021-04-08 MED ORDER — CLEVIDIPINE BUTYRATE 0.5 MG/ML IV EMUL
0.0000 mg/h | INTRAVENOUS | Status: DC
  Administered 2021-04-08: 2 mg/h via INTRAVENOUS
  Filled 2021-04-08: qty 100

## 2021-04-08 MED ORDER — ACETAMINOPHEN 500 MG PO TABS
ORAL_TABLET | ORAL | Status: AC
  Administered 2021-04-08: 1000 mg via ORAL
  Filled 2021-04-08: qty 2

## 2021-04-08 MED ORDER — ACETAMINOPHEN 500 MG PO TABS: 1000.0000 mg | ORAL_TABLET | Freq: Once | ORAL | Status: AC

## 2021-04-08 MED ORDER — PROPOFOL 10 MG/ML IV BOLUS
INTRAVENOUS | Status: AC
  Filled 2021-04-08: qty 20

## 2021-04-08 MED ORDER — THROMBIN (RECOMBINANT) 20000 UNITS EX SOLR
CUTANEOUS | Status: AC
  Filled 2021-04-08: qty 20000

## 2021-04-08 MED ORDER — CHLORHEXIDINE GLUCONATE 4 % EX LIQD: 30.0000 mL | CUTANEOUS | Status: DC

## 2021-04-08 MED ORDER — SODIUM CHLORIDE 0.9% FLUSH: 10.0000 mL | INTRAVENOUS | Status: DC | PRN

## 2021-04-08 MED ORDER — POTASSIUM CHLORIDE 10 MEQ/50ML IV SOLN
10.0000 meq | INTRAVENOUS | Status: AC
  Administered 2021-04-08 (×3): 10 meq via INTRAVENOUS

## 2021-04-08 MED ORDER — PROPOFOL 10 MG/ML IV BOLUS
INTRAVENOUS | Status: DC | PRN
  Administered 2021-04-08: 20 mg via INTRAVENOUS
  Administered 2021-04-08: 40 mg via INTRAVENOUS
  Administered 2021-04-08: 10 mg via INTRAVENOUS

## 2021-04-08 MED ORDER — ACETAMINOPHEN 160 MG/5ML PO SOLN: 650.0000 mg | Freq: Once | ORAL | Status: DC

## 2021-04-08 MED ORDER — OXYCODONE HCL 5 MG PO TABS
5.0000 mg | ORAL_TABLET | ORAL | Status: DC | PRN
  Administered 2021-04-09 (×2): 5 mg via ORAL
  Filled 2021-04-08: qty 2
  Filled 2021-04-08: qty 1

## 2021-04-08 MED ORDER — ORAL CARE MOUTH RINSE
15.0000 mL | Freq: Two times a day (BID) | OROMUCOSAL | Status: DC
  Administered 2021-04-09 – 2021-04-13 (×7): 15 mL via OROMUCOSAL

## 2021-04-08 MED ORDER — VANCOMYCIN HCL IN DEXTROSE 1-5 GM/200ML-% IV SOLN
1000.0000 mg | Freq: Once | INTRAVENOUS | Status: AC
Start: 2021-04-08 — End: 2021-04-08
  Administered 2021-04-08: 1000 mg via INTRAVENOUS
  Filled 2021-04-08: qty 200

## 2021-04-08 MED ORDER — METOPROLOL TARTRATE 5 MG/5ML IV SOLN: 2.5000 mg | INTRAVENOUS | Status: DC | PRN

## 2021-04-08 MED ORDER — MIDAZOLAM HCL (PF) 10 MG/2ML IJ SOLN
INTRAMUSCULAR | Status: AC
  Filled 2021-04-08: qty 2

## 2021-04-08 MED ORDER — PROTAMINE SULFATE 10 MG/ML IV SOLN
INTRAVENOUS | Status: DC | PRN
  Administered 2021-04-08: 30 mg via INTRAVENOUS
  Administered 2021-04-08: 190 mg via INTRAVENOUS

## 2021-04-08 MED ORDER — ASPIRIN EC 325 MG PO TBEC
325.0000 mg | DELAYED_RELEASE_TABLET | Freq: Every day | ORAL | Status: DC
  Administered 2021-04-09: 325 mg via ORAL
  Filled 2021-04-08: qty 1

## 2021-04-08 MED ORDER — DOCUSATE SODIUM 100 MG PO CAPS
200.0000 mg | ORAL_CAPSULE | Freq: Every day | ORAL | Status: DC
  Administered 2021-04-09 – 2021-04-10 (×2): 200 mg via ORAL
  Filled 2021-04-08 (×2): qty 2

## 2021-04-08 MED ORDER — 0.9 % SODIUM CHLORIDE (POUR BTL) OPTIME
TOPICAL | Status: DC | PRN
  Administered 2021-04-08: 5000 mL

## 2021-04-08 MED ORDER — TRAMADOL HCL 50 MG PO TABS: 50.0000 mg | ORAL_TABLET | ORAL | Status: DC | PRN

## 2021-04-08 MED ORDER — SODIUM CHLORIDE 0.9% FLUSH
3.0000 mL | Freq: Two times a day (BID) | INTRAVENOUS | Status: DC
  Administered 2021-04-09 – 2021-04-10 (×3): 3 mL via INTRAVENOUS

## 2021-04-08 MED ORDER — ORAL CARE MOUTH RINSE
15.0000 mL | OROMUCOSAL | Status: DC
  Administered 2021-04-08: 15 mL via OROMUCOSAL

## 2021-04-08 MED ORDER — ALBUMIN HUMAN 5 % IV SOLN: INTRAVENOUS | Status: DC | PRN

## 2021-04-08 MED ORDER — MIDAZOLAM HCL (PF) 5 MG/ML IJ SOLN
INTRAMUSCULAR | Status: DC | PRN
  Administered 2021-04-08 (×3): 2 mg via INTRAVENOUS
  Administered 2021-04-08: 1 mg via INTRAVENOUS
  Administered 2021-04-08: 2 mg via INTRAVENOUS
  Administered 2021-04-08: 1 mg via INTRAVENOUS

## 2021-04-08 MED ORDER — POTASSIUM CHLORIDE 10 MEQ/50ML IV SOLN
10.0000 meq | INTRAVENOUS | Status: AC
  Administered 2021-04-08 (×3): 10 meq via INTRAVENOUS
  Filled 2021-04-08 (×3): qty 50

## 2021-04-08 MED ORDER — LACTATED RINGERS IV SOLN: INTRAVENOUS | Status: DC

## 2021-04-08 MED ORDER — DEXMEDETOMIDINE HCL IN NACL 400 MCG/100ML IV SOLN
0.0000 ug/kg/h | INTRAVENOUS | Status: DC
  Administered 2021-04-08: 0.4 ug/kg/h via INTRAVENOUS
  Filled 2021-04-08: qty 100

## 2021-04-08 MED ORDER — THROMBIN 20000 UNITS EX SOLR
CUTANEOUS | Status: DC | PRN
  Administered 2021-04-08: 20000 [IU] via TOPICAL

## 2021-04-08 MED ORDER — SODIUM CHLORIDE 0.9% FLUSH: 3.0000 mL | INTRAVENOUS | Status: DC | PRN

## 2021-04-08 MED ORDER — FENTANYL CITRATE (PF) 250 MCG/5ML IJ SOLN
INTRAMUSCULAR | Status: DC | PRN
  Administered 2021-04-08: 200 ug via INTRAVENOUS
  Administered 2021-04-08 (×2): 150 ug via INTRAVENOUS
  Administered 2021-04-08: 100 ug via INTRAVENOUS
  Administered 2021-04-08: 50 ug via INTRAVENOUS
  Administered 2021-04-08: 100 ug via INTRAVENOUS
  Administered 2021-04-08: 150 ug via INTRAVENOUS
  Administered 2021-04-08 (×2): 100 ug via INTRAVENOUS

## 2021-04-08 MED ORDER — DEXTROSE 50 % IV SOLN: 0.0000 mL | INTRAVENOUS | Status: DC | PRN

## 2021-04-08 MED ORDER — METOPROLOL TARTRATE 25 MG/10 ML ORAL SUSPENSION
12.5000 mg | Freq: Two times a day (BID) | ORAL | Status: DC
  Filled 2021-04-08: qty 5

## 2021-04-08 MED ORDER — ACETAMINOPHEN 500 MG PO TABS
1000.0000 mg | ORAL_TABLET | Freq: Four times a day (QID) | ORAL | Status: DC
  Administered 2021-04-09 – 2021-04-10 (×6): 1000 mg via ORAL
  Filled 2021-04-08 (×6): qty 2

## 2021-04-08 MED ORDER — BISACODYL 5 MG PO TBEC
10.0000 mg | DELAYED_RELEASE_TABLET | Freq: Every day | ORAL | Status: DC
  Administered 2021-04-09 – 2021-04-10 (×2): 10 mg via ORAL
  Filled 2021-04-08 (×2): qty 2

## 2021-04-08 MED ORDER — METOPROLOL TARTRATE 12.5 MG HALF TABLET
ORAL_TABLET | ORAL | Status: AC
  Filled 2021-04-08: qty 1

## 2021-04-08 MED ORDER — ACETAMINOPHEN 650 MG RE SUPP: 650.0000 mg | Freq: Once | RECTAL | Status: DC

## 2021-04-08 MED ORDER — CHLORHEXIDINE GLUCONATE CLOTH 2 % EX PADS
6.0000 | MEDICATED_PAD | Freq: Every day | CUTANEOUS | Status: DC
  Administered 2021-04-08 – 2021-04-09 (×2): 6 via TOPICAL

## 2021-04-08 MED ORDER — BISACODYL 10 MG RE SUPP
10.0000 mg | Freq: Every day | RECTAL | Status: DC
  Filled 2021-04-08: qty 1

## 2021-04-08 MED ORDER — SODIUM CHLORIDE 0.9 % IV SOLN: INTRAVENOUS | Status: DC | PRN

## 2021-04-08 MED ORDER — METOPROLOL TARTRATE 12.5 MG HALF TABLET: 12.5000 mg | ORAL_TABLET | Freq: Once | ORAL | Status: DC

## 2021-04-08 MED ORDER — MAGNESIUM SULFATE 4 GM/100ML IV SOLN
4.0000 g | Freq: Once | INTRAVENOUS | Status: AC
  Administered 2021-04-08: 4 g via INTRAVENOUS
  Filled 2021-04-08: qty 100

## 2021-04-08 MED ORDER — CHLORHEXIDINE GLUCONATE 0.12% ORAL RINSE (MEDLINE KIT)
15.0000 mL | Freq: Two times a day (BID) | OROMUCOSAL | Status: DC
  Administered 2021-04-08: 15 mL via OROMUCOSAL

## 2021-04-08 MED ORDER — PHENYLEPHRINE 40 MCG/ML (10ML) SYRINGE FOR IV PUSH (FOR BLOOD PRESSURE SUPPORT)
PREFILLED_SYRINGE | INTRAVENOUS | Status: DC | PRN
  Administered 2021-04-08: 80 ug via INTRAVENOUS
  Administered 2021-04-08 (×2): 40 ug via INTRAVENOUS
  Administered 2021-04-08: 80 ug via INTRAVENOUS
  Administered 2021-04-08: 40 ug via INTRAVENOUS

## 2021-04-08 MED ORDER — MORPHINE SULFATE (PF) 2 MG/ML IV SOLN
1.0000 mg | INTRAVENOUS | Status: DC | PRN
  Administered 2021-04-08 – 2021-04-09 (×3): 2 mg via INTRAVENOUS
  Filled 2021-04-08 (×4): qty 1

## 2021-04-08 MED ORDER — INSULIN REGULAR(HUMAN) IN NACL 100-0.9 UT/100ML-% IV SOLN
INTRAVENOUS | Status: DC
  Filled 2021-04-08: qty 100

## 2021-04-08 MED ORDER — HEPARIN SODIUM (PORCINE) 1000 UNIT/ML IJ SOLN
INTRAMUSCULAR | Status: AC
  Filled 2021-04-08: qty 1

## 2021-04-08 MED FILL — Heparin Sodium (Porcine) Inj 1000 Unit/ML: INTRAMUSCULAR | Qty: 2500 | Status: AC

## 2021-04-08 SURGICAL SUPPLY — 106 items
BAG DECANTER FOR FLEXI CONT (MISCELLANEOUS) ×3 IMPLANT
BLADE CLIPPER SURG (BLADE) ×3 IMPLANT
BLADE NEEDLE 3 SS STRL (BLADE) ×6 IMPLANT
BLADE STERNUM SYSTEM 6 (BLADE) ×3 IMPLANT
BNDG ELASTIC 4X5.8 VLCR STR LF (GAUZE/BANDAGES/DRESSINGS) ×3 IMPLANT
BNDG ELASTIC 6X15 VLCR STRL LF (GAUZE/BANDAGES/DRESSINGS) ×3 IMPLANT
BNDG ELASTIC 6X5.8 VLCR STR LF (GAUZE/BANDAGES/DRESSINGS) ×3 IMPLANT
BNDG GAUZE ELAST 4 BULKY (GAUZE/BANDAGES/DRESSINGS) ×3 IMPLANT
CANISTER SUCT 3000ML PPV (MISCELLANEOUS) ×3 IMPLANT
CATH ROBINSON RED A/P 18FR (CATHETERS) ×6 IMPLANT
CATH THORACIC 28FR (CATHETERS) ×3 IMPLANT
CATH THORACIC 36FR (CATHETERS) ×3 IMPLANT
CATH THORACIC 36FR RT ANG (CATHETERS) ×3 IMPLANT
CLIP RETRACTION 3.0MM CORONARY (MISCELLANEOUS) ×3 IMPLANT
CLIP VESOCCLUDE MED 24/CT (CLIP) IMPLANT
CLIP VESOCCLUDE SM WIDE 24/CT (CLIP) IMPLANT
CONTAINER PROTECT SURGISLUSH (MISCELLANEOUS) ×3 IMPLANT
DERMABOND ADVANCED (GAUZE/BANDAGES/DRESSINGS) ×1
DERMABOND ADVANCED .7 DNX12 (GAUZE/BANDAGES/DRESSINGS) ×2 IMPLANT
DRAPE CARDIOVASCULAR INCISE (DRAPES) ×1
DRAPE SRG 135X102X78XABS (DRAPES) ×2 IMPLANT
DRAPE WARM FLUID 44X44 (DRAPES) ×3 IMPLANT
DRSG COVADERM 4X14 (GAUZE/BANDAGES/DRESSINGS) ×3 IMPLANT
ELECT BLADE 4.0 EZ CLEAN MEGAD (MISCELLANEOUS) ×3
ELECT CAUTERY BLADE 6.4 (BLADE) ×3 IMPLANT
ELECT KIT SAFEOP EMG/NMJ (KITS) ×1
ELECT REM PT RETURN 9FT ADLT (ELECTROSURGICAL) ×6
ELECTRODE BLDE 4.0 EZ CLN MEGD (MISCELLANEOUS) ×2 IMPLANT
ELECTRODE REM PT RTRN 9FT ADLT (ELECTROSURGICAL) ×4 IMPLANT
FELT TEFLON 1X6 (MISCELLANEOUS) ×6 IMPLANT
GAUZE SPONGE 4X4 12PLY STRL (GAUZE/BANDAGES/DRESSINGS) ×6 IMPLANT
GLOVE BIO SURGEON STRL SZ 6 (GLOVE) IMPLANT
GLOVE BIO SURGEON STRL SZ 6.5 (GLOVE) IMPLANT
GLOVE BIO SURGEON STRL SZ7 (GLOVE) IMPLANT
GLOVE BIO SURGEON STRL SZ7.5 (GLOVE) IMPLANT
GLOVE BIOGEL PI IND STRL 6 (GLOVE) IMPLANT
GLOVE BIOGEL PI INDICATOR 6 (GLOVE)
GLOVE ORTHO TXT STRL SZ7.5 (GLOVE) IMPLANT
GLOVE SURG MICRO LTX SZ7 (GLOVE) ×6 IMPLANT
GLOVE SURG UNDER POLY LF SZ6.5 (GLOVE) IMPLANT
GLOVE SURG UNDER POLY LF SZ7 (GLOVE) IMPLANT
GOWN STRL REUS W/ TWL LRG LVL3 (GOWN DISPOSABLE) ×8 IMPLANT
GOWN STRL REUS W/ TWL XL LVL3 (GOWN DISPOSABLE) ×2 IMPLANT
GOWN STRL REUS W/TWL LRG LVL3 (GOWN DISPOSABLE) ×4
GOWN STRL REUS W/TWL XL LVL3 (GOWN DISPOSABLE) ×1
HEMOSTAT POWDER SURGIFOAM 1G (HEMOSTASIS) ×9 IMPLANT
HEMOSTAT SURGICEL 2X14 (HEMOSTASIS) ×3 IMPLANT
INSERT FOGARTY 61MM (MISCELLANEOUS) IMPLANT
INSERT FOGARTY XLG (MISCELLANEOUS) IMPLANT
KIT BASIN OR (CUSTOM PROCEDURE TRAY) ×3 IMPLANT
KIT CATH CPB BARTLE (MISCELLANEOUS) ×3 IMPLANT
KIT EMG SRFC ELECT (KITS) ×2 IMPLANT
KIT SUCTION CATH 14FR (SUCTIONS) ×3 IMPLANT
KIT TURNOVER KIT B (KITS) ×3 IMPLANT
KIT VASOVIEW HEMOPRO 2 VH 4000 (KITS) ×3 IMPLANT
NS IRRIG 1000ML POUR BTL (IV SOLUTION) ×15 IMPLANT
PACK E OPEN HEART (SUTURE) ×3 IMPLANT
PACK OPEN HEART (CUSTOM PROCEDURE TRAY) ×3 IMPLANT
PAD ARMBOARD 7.5X6 YLW CONV (MISCELLANEOUS) ×6 IMPLANT
PAD ELECT DEFIB RADIOL ZOLL (MISCELLANEOUS) ×3 IMPLANT
PENCIL BUTTON HOLSTER BLD 10FT (ELECTRODE) ×6 IMPLANT
POSITIONER HEAD DONUT 9IN (MISCELLANEOUS) ×3 IMPLANT
PUNCH AORTIC ROTATE 4.0MM (MISCELLANEOUS) ×3 IMPLANT
PUNCH AORTIC ROTATE 4.5MM 8IN (MISCELLANEOUS) ×3 IMPLANT
PUNCH AORTIC ROTATE 5MM 8IN (MISCELLANEOUS) IMPLANT
SET MPS 3-ND DEL (MISCELLANEOUS) ×3 IMPLANT
SPONGE INTESTINAL PEANUT (DISPOSABLE) IMPLANT
SPONGE LAP 18X18 RF (DISPOSABLE) ×3 IMPLANT
SPONGE LAP 4X18 RFD (DISPOSABLE) ×3 IMPLANT
SUPPORT HEART JANKE-BARRON (MISCELLANEOUS) ×3 IMPLANT
SUT BONE WAX W31G (SUTURE) ×3 IMPLANT
SUT MNCRL AB 4-0 PS2 18 (SUTURE) ×3 IMPLANT
SUT PROLENE 3 0 SH DA (SUTURE) IMPLANT
SUT PROLENE 3 0 SH1 36 (SUTURE) ×3 IMPLANT
SUT PROLENE 4 0 RB 1 (SUTURE)
SUT PROLENE 4 0 SH DA (SUTURE) IMPLANT
SUT PROLENE 4-0 RB1 .5 CRCL 36 (SUTURE) IMPLANT
SUT PROLENE 5 0 C 1 36 (SUTURE) IMPLANT
SUT PROLENE 6 0 C 1 30 (SUTURE) IMPLANT
SUT PROLENE 7 0 BV 1 (SUTURE) IMPLANT
SUT PROLENE 7 0 BV1 MDA (SUTURE) ×9 IMPLANT
SUT PROLENE 8 0 BV175 6 (SUTURE) IMPLANT
SUT SILK  1 MH (SUTURE)
SUT SILK 1 MH (SUTURE) IMPLANT
SUT SILK 2 0 SH (SUTURE) IMPLANT
SUT STEEL 6MS V (SUTURE) ×6 IMPLANT
SUT STEEL STERNAL CCS#1 18IN (SUTURE) IMPLANT
SUT STEEL SZ 6 DBL 3X14 BALL (SUTURE) IMPLANT
SUT VIC AB 1 CTX 36 (SUTURE) ×2
SUT VIC AB 1 CTX36XBRD ANBCTR (SUTURE) ×4 IMPLANT
SUT VIC AB 2-0 CT1 27 (SUTURE) ×1
SUT VIC AB 2-0 CT1 TAPERPNT 27 (SUTURE) ×2 IMPLANT
SUT VIC AB 2-0 CTX 27 (SUTURE) IMPLANT
SUT VIC AB 3-0 SH 27 (SUTURE)
SUT VIC AB 3-0 SH 27X BRD (SUTURE) IMPLANT
SUT VIC AB 3-0 X1 27 (SUTURE) IMPLANT
SUT VICRYL 4-0 PS2 18IN ABS (SUTURE) IMPLANT
SYSTEM SAHARA CHEST DRAIN ATS (WOUND CARE) ×3 IMPLANT
TAPE CLOTH SURG 4X10 WHT LF (GAUZE/BANDAGES/DRESSINGS) ×3 IMPLANT
TAPE PAPER 2X10 WHT MICROPORE (GAUZE/BANDAGES/DRESSINGS) ×3 IMPLANT
TOWEL GREEN STERILE (TOWEL DISPOSABLE) ×3 IMPLANT
TOWEL GREEN STERILE FF (TOWEL DISPOSABLE) ×3 IMPLANT
TRAY FOLEY SLVR 16FR TEMP STAT (SET/KITS/TRAYS/PACK) ×3 IMPLANT
TUBING LAP HI FLOW INSUFFLATIO (TUBING) ×3 IMPLANT
UNDERPAD 30X36 HEAVY ABSORB (UNDERPADS AND DIAPERS) ×3 IMPLANT
WATER STERILE IRR 1000ML POUR (IV SOLUTION) ×6 IMPLANT

## 2021-04-08 NOTE — Anesthesia Procedure Notes (Signed)
Procedure Name: Intubation Date/Time: 04/08/2021 7:49 AM Performed by: Barrington Ellison, CRNA Pre-anesthesia Checklist: Patient identified, Emergency Drugs available, Suction available and Patient being monitored Patient Re-evaluated:Patient Re-evaluated prior to induction Oxygen Delivery Method: Circle System Utilized Preoxygenation: Pre-oxygenation with 100% oxygen Induction Type: IV induction Ventilation: Mask ventilation without difficulty, Two handed mask ventilation required and Oral airway inserted - appropriate to patient size Laryngoscope Size: Mac and 4 Grade View: Grade I Tube type: Oral Tube size: 8.0 mm Number of attempts: 1 Airway Equipment and Method: Stylet and Oral airway Placement Confirmation: ETT inserted through vocal cords under direct vision,  positive ETCO2 and breath sounds checked- equal and bilateral Secured at: 22 cm Tube secured with: Tape Dental Injury: Teeth and Oropharynx as per pre-operative assessment

## 2021-04-08 NOTE — Procedures (Signed)
Extubation Procedure Note  Patient Details:   Name: Andres Green DOB: 30-Jun-1946 MRN: [id number]   Airway Documentation:  Positive cuff leak. NIF -22 cmH2O, VC 1400 mL   Vent end date: 04/08/21 Vent end time: 2205   Evaluation  O2 sats: stable throughout Complications: No apparent complications Patient did tolerate procedure well. Bilateral Breath Sounds: Diminished   Yes  Pt placed on 3L [place].  Marjory Sneddon M 04/08/2021, 10:11 PM

## 2021-04-08 NOTE — H&P (Signed)
[address]       [place] [id number]             [phone]      Cardiothoracic Surgery Admission History and Physical   PCP is Verl BlalockBreedlove, Brent C, PA-C Referring Provider is Corky CraftsVaranasi, Jayadeep S, MD      Chief Complaint  Patient presents with  . Coronary Artery Disease        HPI:  The patient is a 75 year old gentleman with no prior cardiac history who reports developing fatigue and substernal chest discomfort radiating down his left arm while pushing a lawnmower a couple weeks ago.  This resolved with rest but he had a couple more episodes and [facility] [place] he lives.  He said that initial testing did not show anything.  He had further episodes and return to his PCP and was referred to cardiology.  He underwent a gated cardiac CTA on 03/22/2021 which showed a calcium score of 382 putting him in the 62nd percentile for his age and sex.  There was significant disease in the LAD and further FFR analysis showed a significant decrease in the mid and distal LAD of 0.53, 0.54, and 0.50.  The first diagonal also was abnormal at its bifurcation from the LAD with an FFR of 0.54.  The left main, left circumflex and right coronary artery were normal.  He subsequently underwent cardiac catheterization yesterday which showed severe three-vessel coronary disease with a 90% proximal LAD stenosis with 50% mid and distal stenoses..  The first diagonal had about 75% ostial stenosis.  The left circumflex had 75% proximal to mid stenosis with 80% stenosis and a second marginal branch.  The right coronary artery was diffusely irregular with 90% stenosis at the ostium of the PDA.  Left ventricular ejection fraction was 55 to 65%.  There is no aortic stenosis.  The patient is married and [place].  He lived in [place] for many years and [place] in the past  [place].  He and his wife care for their 577-year-old autistic grandson.  He is a lifelong non-smoker.  His father had coronary bypass surgery after an MI in his 4150s.      Past Medical History:  Diagnosis Date  . Chest pain   . Fatigue   . Left shoulder pain          Past Surgical History:  Procedure Laterality Date  . LEFT HEART CATH AND CORONARY ANGIOGRAPHY N/A 03/30/2021   Procedure: LEFT HEART CATH AND CORONARY ANGIOGRAPHY;  Surgeon: Corky CraftsVaranasi, Jayadeep S, MD;  [facility];  Service: Cardiovascular;  Laterality: N/A;  . WIDSOM TOOTH REMOVAL           Family History  Problem Relation Age of Onset  . Stroke Mother   . Diabetes Mother   . Heart disease Father   . Cancer - Prostate Father     Social History Social History       Tobacco Use  . Smoking status: Never Smoker  . Smokeless tobacco: Never Used  Substance Use Topics  . Alcohol use: Yes          Current Outpatient Medications  Medication Sig Dispense Refill  . acetaminophen (TYLENOL) 500 MG tablet Take 500-1,000 mg by mouth every 6 (six) hours as needed (headaches/pain).    Marland Kitchen. aspirin EC  81 MG tablet Take 1 tablet (81 mg total) by mouth daily. Swallow whole. (Patient taking differently: Take 81 mg by mouth in the morning. Swallow whole.) 90 tablet 3  . calamine lotion Apply 1 application topically as needed (poison ivy rash).    . isosorbide mononitrate (IMDUR) 30 MG 24 hr tablet Take 0.5 tablets (15 mg total) by mouth daily. (Patient taking differently: Take 15 mg by mouth in the morning.) 45 tablet 2  . nitroGLYCERIN (NITROSTAT) 0.4 MG SL tablet Place 1 tablet (0.4 mg total) under the tongue every 5 (five) minutes as needed for chest pain. (Patient taking differently: Place 0.4 mg under the tongue every 5 (five) minutes x 3 doses as needed for chest pain.) 25 tablet 6   No current facility-administered medications for this visit.    No Known Allergies  Review of  Systems  Constitutional: Positive for activity change and fatigue.  HENT: Negative.   Eyes: Negative.   Respiratory: Positive for chest tightness and shortness of breath.   Cardiovascular: Positive for chest pain. Negative for palpitations and leg swelling.  Endocrine: Negative.   Genitourinary: Negative.   Musculoskeletal: Negative.   Allergic/Immunologic: Negative.   Neurological: Negative for dizziness and syncope.  Hematological: Negative.   Psychiatric/Behavioral: Negative.     BP 131/69 (BP Location: Right Arm, Patient Position: Sitting)   Pulse 64   Resp 20   Ht 5\' 8"  (1.727 m)   Wt 172 lb (78 kg)   SpO2 98% Comment: RA  BMI 26.15 kg/m  Physical Exam Constitutional:      Appearance: Normal appearance. He is normal weight.  HENT:     Head: Normocephalic and atraumatic.  Eyes:     Extraocular Movements: Extraocular movements intact.     Conjunctiva/sclera: Conjunctivae normal.     Pupils: Pupils are equal, round, and reactive to light.  Neck:     Vascular: No carotid bruit.  Cardiovascular:     Rate and Rhythm: Normal rate and regular rhythm.     Heart sounds: No murmur heard.   Pulmonary:     Effort: Pulmonary effort is normal.     Breath sounds: Normal breath sounds.  Abdominal:     General: Abdomen is flat.     Palpations: Abdomen is soft.  Musculoskeletal:        General: No swelling. Normal range of motion.     Cervical back: Normal range of motion and neck supple.  Lymphadenopathy:     Cervical: No cervical adenopathy.  Skin:    General: Skin is warm and dry.  Neurological:     General: No focal deficit present.     Mental Status: He is alert and oriented to person, place, and time.  Psychiatric:        Mood and Affect: Mood normal.        Behavior: Behavior normal.        Thought Content: Thought content normal.        Judgment: Judgment normal.      Diagnostic Tests:  Physicians  Panel Physicians Referring Physician Case  Authorizing Physician  , MD (Primary)      Procedures  LEFT HEART CATH AND CORONARY ANGIOGRAPHY   Conclusion    Prox LAD lesion is 90% stenosed.  1st Diag lesion is 75% stenosed.  Mid LAD lesion is 50% stenosed.  2nd Diag lesion is 75% stenosed.  Dist LAD lesion is 50% stenosed.  2nd Mrg lesion is 80% stenosed.  Prox  Cx to Mid Cx lesion is 75% stenosed.  Ost Cx lesion is 50% stenosed.  RPDA lesion is 90% stenosed.  The left ventricular systolic function is normal.  LV end diastolic pressure is normal.  The left ventricular ejection fraction is 55-65% by visual estimate.  There is no aortic valve stenosis.  Cardiac surgery consult planned due to multivessel disease.   Patient not having any rest pain. I stressed to him and his wife that he needs to avoid any exertion that brings on chest pressure.     Recommendations  Antiplatelet/Anticoag Recommend uninterrupted dual antiplatelet therapy with Aspirin 81mg  daily.  Discharge Date In the absence of any other complications or medical issues, we expect the patient to be ready for discharge from a cath perspective on 03/30/2021.   Indications  Abnormal cardiac CT angiography [R93.1 (ICD-10-CM)]   Procedural Details  Technical Details The risks, benefits, and details of the procedure were explained to the patient. The patient verbalized understanding and wanted to proceed. Informed written consent was obtained.  PROCEDURE TECHNIQUE: After Xylocaine anesthesia a 36F slender sheath was placed in the right radial artery with a single anterior needle wall stick. IV Heparin was given. Left ventriculography was done using a JR4 catheter. Right coronary angiography was done using a Judkins R4 guide catheter. Left coronary angiography was done using a Judkins L3.5 guide catheter. A TR band was used for hemostasis.  Contrast: 55 cc  Estimated blood loss <50 mL.   During  this procedure medications were administered to achieve and maintain moderate conscious sedation while the patient's heart rate, blood pressure, and oxygen saturation were continuously monitored and I was present face-to-face 100% of this time.   Medications (Filter: Administrations occurring from 0940 to 1011 on 03/30/21) (important) Continuous medications are totaled by the amount administered until 03/30/21 1011.    Heparin (Porcine) in NaCl 1000-0.9 UT/500ML-% SOLN (mL) Total volume:  1,000 mL  Date/Time Rate/Dose/Volume Action   03/30/21 0942 500 mL Given   0943 500 mL Given    fentaNYL (SUBLIMAZE) injection (mcg) Total dose:  25 mcg  Date/Time Rate/Dose/Volume Action   03/30/21 0943 25 mcg Given    midazolam (VERSED) injection (mg) Total dose:  2 mg  Date/Time Rate/Dose/Volume Action   03/30/21 0943 2 mg Given    lidocaine (PF) (XYLOCAINE) 1 % injection (mL) Total volume:  2 mL  Date/Time Rate/Dose/Volume Action   03/30/21 0948 2 mL Given    heparin sodium (porcine) injection (Units) Total dose:  4,000 Units  Date/Time Rate/Dose/Volume Action   03/30/21 0950 4,000 Units Given    iohexol (OMNIPAQUE) 350 MG/ML injection (mL) Total volume:  55 mL  Date/Time Rate/Dose/Volume Action   03/30/21 1009 55 mL Given    Sedation Time  Sedation Time Physician-1: 19 minutes 32 seconds   Contrast  Medication Name Total Dose  iohexol (OMNIPAQUE) 350 MG/ML injection 55 mL    Radiation/Fluoro  Fluoro time: 2.4 (min) DAP: 14575 (mGycm2) Cumulative Air Kerma: 295 (mGy)   Complications   Complications documented before study signed (03/30/2021 10:37 AM)    No complications were associated with this study.  Documented by 05/30/2021, MD - 03/30/2021 10:25 AM     Coronary Findings   Diagnostic Dominance: Right  Left Anterior Descending  Prox LAD lesion is 90% stenosed.  Mid LAD lesion is 50% stenosed.   Dist LAD lesion is 50% stenosed.  First Diagonal Branch  1st Diag lesion is 75% stenosed.  Second 05/30/2021  Vessel is small in size.  2nd Diag lesion is 75% stenosed.  Left Circumflex  Ost Cx lesion is 50% stenosed. The lesion is eccentric.  Prox Cx to Mid Cx lesion is 75% stenosed.  First Obtuse Marginal Branch  Vessel is small in size.  Second Obtuse Marginal Branch  2nd Mrg lesion is 80% stenosed.  Right Coronary Artery  The vessel exhibits minimal luminal irregularities.  Right Posterior Descending Artery  RPDA lesion is 90% stenosed.   Intervention   No interventions have been documented.  Wall Motion       Resting       All segments of the heart are normal.           Left Heart  Left Ventricle The left ventricular size is normal. The left ventricular systolic function is normal. LV end diastolic pressure is normal. The left ventricular ejection fraction is 55-65% by visual estimate. No regional wall motion abnormalities.  Aortic Valve There is no aortic valve stenosis.   Coronary Diagrams   Diagnostic Dominance: Right    Intervention    Implants       No implant documentation for this case.   Syngo Images  Show images for CARDIAC CATHETERIZATION  Images on Long Term Storage  Show images for Ryder, Man to Procedure Log  Procedure Log     Hemo Data  Flowsheet Row Most Recent Value  AO Systolic Pressure 91 mmHg  AO Diastolic Pressure 46 mmHg  AO Mean 65 mmHg  LV Systolic Pressure 94 mmHg  LV Diastolic Pressure 0 mmHg  LV EDP 3 mmHg  AOp Systolic Pressure 91 mmHg  AOp Diastolic Pressure 48 mmHg  AOp Mean Pressure 65 mmHg  LVp Systolic Pressure 93 mmHg  LVp Diastolic Pressure 0 mmHg  LVp EDP Pressure 2 mmHg     Impression:  This 75 year old gentleman has severe multivessel coronary disease with stable anginal symptoms.  I agree that the best treatment for him is coronary  bypass graft surgery to resolve his symptoms, increase his quality of life, and improve his long-term prognosis.  I reviewed the cardiac cath films with him and answered all of his questions.  I discussed the operative procedure with the patient including alternatives, benefits and risks; including but not limited to bleeding, blood transfusion, infection, stroke, myocardial infarction, graft failure, heart block requiring a permanent pacemaker, organ dysfunction, and death.  Shelly Flatten understands and agrees to proceed.   Plan:  Coronary artery bypass graft surgery.    Alleen Borne, MD [facility] [phone]

## 2021-04-08 NOTE — Anesthesia Postprocedure Evaluation (Signed)
Anesthesia Post Note  Patient: Andres Green  Procedure(s) Performed: CORONARY ARTERY BYPASS GRAFTING (CABG)x6. LEFT INTERNAL MAMMARY ARTERY. RIGHT ENDOSCOPIC SAPHENOUS VEIN HARVESTING. (N/A Chest) TRANSESOPHAGEAL ECHOCARDIOGRAM (TEE) (N/A )     Patient location during evaluation: SICU Anesthesia Type: General Level of consciousness: sedated Pain management: pain level controlled Vital Signs Assessment: post-procedure vital signs reviewed and stable Respiratory status: patient remains intubated per anesthesia plan Cardiovascular status: stable Postop Assessment: no apparent nausea or vomiting Anesthetic complications: no   No complications documented.  Last Vitals:  Vitals:   04/08/21 0721 04/08/21 1414  BP:    Pulse: (!) 41   Resp: 14   Temp:    SpO2: 100% 100%    Last Pain:  Vitals:   04/08/21 0559  TempSrc:   PainSc: 0-No pain                 Kennieth Rad

## 2021-04-08 NOTE — Progress Notes (Signed)
Communicated with respiratory, ready to start rapid wean per Dr. Laneta Simmers.

## 2021-04-08 NOTE — Anesthesia Procedure Notes (Signed)
Central Venous Catheter Insertion Performed by: Suzette Battiest, MD, anesthesiologist Start/End5/19/2022 7:00 AM, 04/08/2021 7:15 AM Patient location: Pre-op. Preanesthetic checklist: patient identified, IV checked, site marked, risks and benefits discussed, surgical consent, monitors and equipment checked, pre-op evaluation, timeout performed and anesthesia consent Position: Trendelenburg Lidocaine 1% used for infiltration and patient sedated Hand hygiene performed , maximum sterile barriers used  and Seldinger technique used Catheter size: 9 Fr Total catheter length 10. Central line and PA cath was placed.MAC introducer Swan type:thermodilution PA Cath depth:50 Procedure performed using ultrasound guided technique. Ultrasound Notes:anatomy identified, needle tip was noted to be adjacent to the nerve/plexus identified, no ultrasound evidence of intravascular and/or intraneural injection and image(s) printed for medical record Attempts: 1 Following insertion, line sutured, dressing applied and Biopatch. Post procedure assessment: blood return through all ports, free fluid flow and no air  Patient tolerated the procedure well with no immediate complications.

## 2021-04-08 NOTE — Progress Notes (Signed)
Patient ID: Andres Green, male   DOB: September 28, 1946, 75 y.o.   MRN: [id number]  TCTS Evening Rounds:   Hemodynamically stable  CI = 2.2  Has started to wake up on vent.    Urine output good  CT output has been 200 + cc/hr for the first 3 hrs back from OR after minimal out initially after closing.  CBC    Component Value Date/Time   WBC 13.7 (H) 04/08/2021 1401   RBC 3.40 (L) 04/08/2021 1401   HGB 11.1 (L) 04/08/2021 1401   HGB 14.2 03/24/2021 1246   HCT 32.6 (L) 04/08/2021 1401   HCT 40.9 03/24/2021 1246   PLT 129 (L) 04/08/2021 1401   PLT 223 03/24/2021 1246   MCV 95.9 04/08/2021 1401   MCV 94 03/24/2021 1246   MCH 32.6 04/08/2021 1401   MCHC 34.0 04/08/2021 1401   RDW 11.9 04/08/2021 1401   RDW 12.6 03/24/2021 1246     BMET    Component Value Date/Time   NA 136 04/08/2021 1301   NA 141 03/24/2021 1246   K 4.2 04/08/2021 1301   CL 101 04/08/2021 1301   CO2 22 04/06/2021 0920   GLUCOSE 142 (H) 04/08/2021 1301   BUN 13 04/08/2021 1301   BUN 17 03/24/2021 1246   CREATININE 0.70 04/08/2021 1301   CALCIUM 9.0 04/06/2021 0920   GFRNONAA >60 04/06/2021 0920     A/P:  Postop chest tube output still too high for extubation. His INR was 1.5. Will give him 1 unit of plts and 2 FFP.  Keep SBP <120 and continue close observation.

## 2021-04-08 NOTE — Progress Notes (Signed)
Dr Bartle called and made aware of CT output >100 for last two hours. Provided with ABG and lab results. Weaning parameters met. Per MD okay to extubate and keep SBP <120. Post extubation, practiced IS with patient effort 1500 x5. Given pain medication for patient rated 6/10 pain (described as extremely uncomfortable). Educated patient on sternal precautions and IS use.  

## 2021-04-08 NOTE — Anesthesia Procedure Notes (Signed)
Arterial Line Insertion Start/End5/19/2022 6:55 AM, 04/08/2021 7:00 AM Performed by: De Nurse, CRNA, CRNA  Preanesthetic checklist: patient identified, risks and benefits discussed and pre-op evaluation Lidocaine 1% used for infiltration and patient sedated Right, radial was placed Catheter size: 20 G Hand hygiene performed  and maximum sterile barriers used  Allen's test indicative of satisfactory collateral circulation Attempts: 1 Procedure performed without using ultrasound guided technique. Following insertion, dressing applied and Biopatch. Post procedure assessment: normal  Patient tolerated the procedure well with no immediate complications.

## 2021-04-08 NOTE — Transfer of Care (Signed)
Immediate Anesthesia Transfer of Care Note  Patient: Andres Green  Procedure(s) Performed: CORONARY ARTERY BYPASS GRAFTING (CABG)x6. LEFT INTERNAL MAMMARY ARTERY. RIGHT ENDOSCOPIC SAPHENOUS VEIN HARVESTING. (N/A Chest) TRANSESOPHAGEAL ECHOCARDIOGRAM (TEE) (N/A )  Patient Location: ICU  Anesthesia Type:General  Level of Consciousness: Patient remains intubated per anesthesia plan  Airway & Oxygen Therapy: Patient remains intubated per anesthesia plan and Patient placed on Ventilator (see vital sign flow sheet for setting)  Post-op Assessment: Report given to RN  Post vital signs: Reviewed and stable  Last Vitals:  Vitals Value Taken Time  BP    Temp    Pulse    Resp    SpO2      Last Pain:  Vitals:   04/08/21 0559  TempSrc:   PainSc: 0-No pain         Complications: No complications documented.

## 2021-04-08 NOTE — Op Note (Signed)
CARDIOVASCULAR SURGERY OPERATIVE NOTE  04/08/2021  Surgeon:  Alleen Borne, MD  First Assistant: Jillyn Hidden,  PA-C   Preoperative Diagnosis:  Severe multi-vessel coronary artery disease   Postoperative Diagnosis:  Same   Procedure:  1. Median Sternotomy 2. Extracorporeal circulation 3.   Coronary artery bypass grafting x 6   Left internal mammary artery graft to the LAD  SVG to diagonal 1  Sequential SVG to OM1 and OM2  Sequential SVG to PDA and PL (RCA)   4.   Endoscopic vein harvest from the right leg   Anesthesia:  General Endotracheal   Clinical History/Surgical Indication:  The patient is a 75 year old gentleman with no prior cardiac history who reports developing fatigue and substernal chest discomfort radiating down his left arm while pushing a lawnmower a couple weeks ago. This resolved with rest but he had a couple more episodes and [facility] He said that initial testing did not show anything. He had further episodes and return to his PCP and was referred to cardiology. He underwent a gated cardiac CTA on 03/22/2021 which showed a calcium score of 382 putting him in the 62ndpercentile for his age and sex. There was significant disease in the LAD and further FFR analysis showed a significant decrease in the mid and distal LAD of 0.53, 0.54, and 0.50. The first diagonal also was abnormal at its bifurcation from the LAD with an FFR of 0.54. The left main,left circumflex and right coronary artery were normal. He subsequently underwent cardiac catheterization yesterday which showed severe three-vessel coronary disease with a 90% proximal LAD stenosis with 50% mid and distal stenoses.. The first diagonal had about 75% ostial stenosis. The left circumflex had 75% proximal to mid stenosis with 80%  stenosis and a second marginal branch. The right coronary artery was diffusely irregular with 90% stenosis at the ostium of the PDA. Left ventricular ejection fraction was 55 to 65%. There is no aortic stenosis.  I agree that the best treatment for him is coronary bypass graft surgery to resolve his symptoms, increase his quality of life, and improve his long-term prognosis. I reviewed the cardiac cath films with him and answered all of his questions. I discussed the operative procedure with the patient including alternatives, benefits and risks; including but not limited to bleeding, blood transfusion, infection, stroke, myocardial infarction, graft failure, heart block requiring a permanent pacemaker, organ dysfunction, and death.Levelle Edelen Geilsunderstands and agrees to proceed.   Preparation:  The patient was seen in the preoperative holding area and the correct patient, correct operation were confirmed with the patient after reviewing the medical record and catheterization. The consent was signed by me. Preoperative antibiotics were given. A pulmonary arterial line and radial arterial line were placed by the anesthesia team. The patient was taken back to the operating room and positioned supine on the operating room table. After being placed under general endotracheal anesthesia by the anesthesia team a foley catheter was placed. The neck, chest, abdomen, and both legs were prepped with betadine soap and solution and draped in the usual sterile manner. A surgical time-out was taken and the correct patient and operative procedure were confirmed with the nursing and anesthesia staff.   Cardiopulmonary Bypass:  A median sternotomy was performed. The pericardium was opened in the midline. Right ventricular function appeared normal. The ascending aorta was of normal size and had no palpable plaque. There were no contraindications to aortic cannulation  or cross-clamping. The patient was fully  systemically heparinized and the ACT was maintained > 400 sec. The proximal aortic arch was cannulated with a 20 F aortic cannula for arterial inflow. Venous cannulation was performed via the right atrial appendage using a two-staged venous cannula. An antegrade cardioplegia/vent cannula was inserted into the mid-ascending aorta. Aortic occlusion was performed with a single cross-clamp. Systemic cooling to 32 degrees Centigrade and topical cooling of the heart with iced saline were used. Hyperkalemic antegrade cold blood cardioplegia was used to induce diastolic arrest and was then given at about 20 minute intervals throughout the period of arrest to maintain myocardial temperature at or below 10 degrees centigrade. A temperature probe was inserted into the interventricular septum and an insulating pad was placed in the pericardium.   Left internal mammary artery harvest:  The left side of the sternum was retracted using the Rultract retractor. The left internal mammary artery was harvested as a pedicle graft. All side branches were clipped. It was a medium-sized vessel of good quality with excellent blood flow. It was ligated distally and divided. It was sprayed with topical papaverine solution to prevent vasospasm.   Endoscopic vein harvest:  The right greater saphenous vein was harvested endoscopically through a 2 cm incision medial to the right knee. It was harvested from the upper thigh to below the knee. It was a medium-sized vein of good quality. The side branches were all ligated with 4-0 silk ties.    Coronary arteries:  The coronary arteries were examined.   LAD:  Large vessel with proximal and mid disease. D1 moderate sized with diffuse disease. D2 small and diffusely diseased.  LCX:  OM1 and OM2 both moderate sized with no distal disease.  RCA:  PDA intramyocardial and lying beneath a large PD vein. I was able to locate it proximally just after the ostial stenosis where it was  suitable for grafting. The PL1 was a medium sized vessel with only a short segmental plaque corresponding to the stenosis on cath. The other PL branches were tiny.   Grafts:  1. LIMA to the LAD: 2.5 mm. It was sewn end to side using 8-0 prolene continuous suture. 2. SVG to D1:  1.6 mm. It was sewn end to side using 7-0 prolene continuous suture. 3. Sequential SVG to OM1:  1.6 mm. It was sewn sequential side to side using 7-0 prolene continuous suture. 4.   Sequential SVG to OM2:  1.6 mm. It was sewn sequential end to side using            7-0 prolene continuous suture. 5.    Sequential SVG to PDA:  1.6 mm. It was sewn sequential side to side using           7-0 prolene continuous suture. 6.   Sequential SVG to PL1:  1.6 mm. It was sewn sequential end to side using 7-0       prolene continuous suture.  The proximal vein graft anastomoses were performed to the mid-ascending aorta using continuous 6-0 prolene suture. Graft markers were placed around the proximal anastomoses.   Completion:  The patient was rewarmed to 37 degrees Centigrade. The clamp was removed from the LIMA pedicle and there was rapid warming of the septum and return of ventricular fibrillation. The crossclamp was removed with a time of 117 minutes. There was spontaneous return of sinus rhythm. The distal and proximal anastomoses were checked for hemostasis. The position of the grafts was satisfactory. Two temporary  epicardial pacing wires were placed on the right atrium and two on the right ventricle. The patient was weaned from CPB without difficulty on no inotropes. CPB time was 135 minutes. Cardiac output was 5 LPM. TEE showed normal LV systolic function.  Heparin was fully reversed with protamine and the aortic and venous cannulas removed. Hemostasis was achieved. Mediastinal and left pleural drainage tubes were placed. The sternum was closed with  #6 stainless steel wires. The fascia was closed with continuous # 1 vicryl suture.  The subcutaneous tissue was closed with 2-0 vicryl continuous suture. The skin was closed with 3-0 vicryl subcuticular suture. All sponge, needle, and instrument counts were reported correct at the end of the case. Dry sterile dressings were placed over the incisions and around the chest tubes which were connected to pleurevac suction. The patient was then transported to the surgical intensive care unit in stable condition.

## 2021-04-08 NOTE — Brief Op Note (Signed)
04/08/2021  12:26 PM  PATIENT:  Andres Green  75 y.o. male  PRE-OPERATIVE DIAGNOSIS:  CAD  POST-OPERATIVE DIAGNOSIS:  CAD  PROCEDURES:  CORONARY ARTERY BYPASS GRAFTING x 6 WITH LEFT INTERNAL MAMMARY ARTERY and RIGHT ENDOSCOPICALLY HARVESTED SAPHENOUS VEIN   LIMA->LAD SVG->D1 SVG->OM1->OM2 SVG->PDA->PLB of RCA  Vein harvest time:   Vein prep time  TRANSESOPHAGEAL ECHOCARDIOGRAM   SURGEON:  Alleen Borne, MD   PHYSICIAN ASSISTANT: Ora Mcnatt  ASSISTANTS: Staff RNFA  ANESTHESIA:   general  EBL:  Per anesthesia and perfusion records   BLOOD ADMINISTERED:none  DRAINS: Left pleural and mediastinal drains   LOCAL MEDICATIONS USED:  NONE  SPECIMEN:  No Specimen  DISPOSITION OF SPECIMEN:  N/A  COUNTS:  YES  DICTATION: .Dragon Dictation  PLAN OF CARE: Admit to inpatient   PATIENT DISPOSITION:  ICU - intubated and hemodynamically stable.   Delay start of Pharmacological VTE agent (>24hrs) due to surgical blood loss or risk of bleeding: yes

## 2021-04-08 NOTE — Interval H&P Note (Signed)
History and Physical Interval Note:  04/08/2021 7:05 AM  Andres Green  has presented today for surgery, with the diagnosis of CAD.  The various methods of treatment have been discussed with the patient and family. After consideration of risks, benefits and other options for treatment, the patient has consented to  Procedure(s): CORONARY ARTERY BYPASS GRAFTING (CABG) (N/A) TRANSESOPHAGEAL ECHOCARDIOGRAM (TEE) (N/A) as a surgical intervention.  The patient's history has been reviewed, patient examined, no change in status, stable for surgery.  I have reviewed the patient's chart and labs.  Questions were answered to the patient's satisfaction.     Alleen Borne

## 2021-04-08 NOTE — Plan of Care (Signed)

## 2021-04-08 NOTE — Anesthesia Procedure Notes (Signed)
Central Venous Catheter Insertion Performed by: Marcene Duos, MD, anesthesiologist Start/End5/19/2022 7:00 AM, 04/08/2021 7:15 AM Patient location: Pre-op. Preanesthetic checklist: patient identified, IV checked, site marked, risks and benefits discussed, surgical consent, monitors and equipment checked, pre-op evaluation, timeout performed and anesthesia consent Hand hygiene performed  and maximum sterile barriers used  PA cath was placed.Swan type:thermodilution PA Cath depth:50 Procedure performed without using ultrasound guided technique. Attempts: 1 Patient tolerated the procedure well with no immediate complications.

## 2021-04-09 ENCOUNTER — Other Ambulatory Visit (HOSPITAL_COMMUNITY): Payer: Federal, State, Local not specified - PPO

## 2021-04-09 ENCOUNTER — Encounter (HOSPITAL_COMMUNITY): Payer: Self-pay | Admitting: Surgery

## 2021-04-09 ENCOUNTER — Inpatient Hospital Stay (HOSPITAL_COMMUNITY): Payer: Federal, State, Local not specified - PPO

## 2021-04-09 ENCOUNTER — Encounter (HOSPITAL_COMMUNITY): Payer: Self-pay

## 2021-04-09 LAB — PREPARE PLATELET PHERESIS: Unit division: 0

## 2021-04-09 LAB — BASIC METABOLIC PANEL
Anion gap: 5 (ref 5–15)
BUN: 13 mg/dL (ref 8–23)
CO2: 24 mmol/L (ref 22–32)
Calcium: 7.6 mg/dL — ABNORMAL LOW (ref 8.9–10.3)
Chloride: 105 mmol/L (ref 98–111)
Creatinine, Ser: 1 mg/dL (ref 0.61–1.24)
GFR, Estimated: >60 mL/min (ref >60)
Glucose, Bld: 149 mg/dL — ABNORMAL HIGH (ref 70–99)
Potassium: 3.7 mmol/L (ref 3.5–5.1)
Sodium: 134 mmol/L — ABNORMAL LOW (ref 135–145)

## 2021-04-09 LAB — BASIC METABOLIC PANEL WITH GFR
Anion gap: 5 (ref 5–15)
BUN: 12 mg/dL (ref 8–23)
CO2: 23 mmol/L (ref 22–32)
Calcium: 7.3 mg/dL — ABNORMAL LOW (ref 8.9–10.3)
Chloride: 107 mmol/L (ref 98–111)
Creatinine, Ser: 0.92 mg/dL (ref 0.61–1.24)
GFR, Estimated: >60 mL/min
Glucose, Bld: 103 mg/dL — ABNORMAL HIGH (ref 70–99)
Potassium: 4 mmol/L (ref 3.5–5.1)
Sodium: 135 mmol/L (ref 135–145)

## 2021-04-09 LAB — GLUCOSE, CAPILLARY
Glucose-Capillary: 110 mg/dL — ABNORMAL HIGH (ref 70–99)
Glucose-Capillary: 111 mg/dL — ABNORMAL HIGH (ref 70–99)
Glucose-Capillary: 111 mg/dL — ABNORMAL HIGH (ref 70–99)
Glucose-Capillary: 113 mg/dL — ABNORMAL HIGH (ref 70–99)
Glucose-Capillary: 124 mg/dL — ABNORMAL HIGH (ref 70–99)
Glucose-Capillary: 124 mg/dL — ABNORMAL HIGH (ref 70–99)
Glucose-Capillary: 125 mg/dL — ABNORMAL HIGH (ref 70–99)
Glucose-Capillary: 129 mg/dL — ABNORMAL HIGH (ref 70–99)
Glucose-Capillary: 137 mg/dL — ABNORMAL HIGH (ref 70–99)
Glucose-Capillary: 138 mg/dL — ABNORMAL HIGH (ref 70–99)
Glucose-Capillary: 152 mg/dL — ABNORMAL HIGH (ref 70–99)

## 2021-04-09 LAB — BPAM FFP
Blood Product Expiration Date: 202205242359
Blood Product Expiration Date: 202205242359
ISSUE DATE / TIME: 202205191823
ISSUE DATE / TIME: 202205191855
Unit Type and Rh: 7300
Unit Type and Rh: 7300

## 2021-04-09 LAB — CBC
HCT: 23.9 % — ABNORMAL LOW (ref 39.0–52.0)
HCT: 24.8 % — ABNORMAL LOW (ref 39.0–52.0)
Hemoglobin: 8.1 g/dL — ABNORMAL LOW (ref 13.0–17.0)
Hemoglobin: 8.1 g/dL — ABNORMAL LOW (ref 13.0–17.0)
MCH: 32.7 pg (ref 26.0–34.0)
MCH: 32.1 pg (ref 26.0–34.0)
MCHC: 33.9 g/dL (ref 30.0–36.0)
MCHC: 32.7 g/dL (ref 30.0–36.0)
MCV: 96.4 fL (ref 80.0–100.0)
MCV: 98.4 fL (ref 80.0–100.0)
Platelets: 146 K/uL — ABNORMAL LOW (ref 150–400)
Platelets: 150 10*3/uL (ref 150–400)
RBC: 2.48 MIL/uL — ABNORMAL LOW (ref 4.22–5.81)
RBC: 2.52 MIL/uL — ABNORMAL LOW (ref 4.22–5.81)
RDW: 12.1 % (ref 11.5–15.5)
RDW: 12.4 % (ref 11.5–15.5)
WBC: 13.2 K/uL — ABNORMAL HIGH (ref 4.0–10.5)
WBC: 13 10*3/uL — ABNORMAL HIGH (ref 4.0–10.5)
nRBC: 0 % (ref 0.0–0.2)
nRBC: 0 % (ref 0.0–0.2)

## 2021-04-09 LAB — PREPARE FRESH FROZEN PLASMA
Unit division: 0
Unit division: 0

## 2021-04-09 LAB — TRIGLYCERIDES: Triglycerides: 63 mg/dL (ref <150)

## 2021-04-09 LAB — BPAM PLATELET PHERESIS
Blood Product Expiration Date: 202205222359
ISSUE DATE / TIME: 202205191740
Unit Type and Rh: 5100

## 2021-04-09 LAB — TYPE AND SCREEN
ABO/RH(D): B POS
Antibody Screen: NEGATIVE

## 2021-04-09 LAB — MAGNESIUM
Magnesium: 2.6 mg/dL — ABNORMAL HIGH (ref 1.7–2.4)
Magnesium: 2.5 mg/dL — ABNORMAL HIGH (ref 1.7–2.4)

## 2021-04-09 MED ORDER — INSULIN DETEMIR 100 UNIT/ML ~~LOC~~ SOLN
30.0000 [IU] | Freq: Once | SUBCUTANEOUS | Status: AC
Start: 2021-04-09 — End: 2021-04-09
  Administered 2021-04-09: 30 [IU] via SUBCUTANEOUS
  Filled 2021-04-09: qty 0.3

## 2021-04-09 MED ORDER — FUROSEMIDE 10 MG/ML IJ SOLN
40.0000 mg | Freq: Once | INTRAMUSCULAR | Status: AC
  Administered 2021-04-09: 40 mg via INTRAVENOUS
  Filled 2021-04-09: qty 4

## 2021-04-09 MED ORDER — TRAMADOL HCL 50 MG PO TABS
50.0000 mg | ORAL_TABLET | ORAL | Status: DC | PRN
Start: 2021-04-09 — End: 2021-04-10
  Administered 2021-04-09: 50 mg via ORAL
  Filled 2021-04-09: qty 1

## 2021-04-09 MED ORDER — POTASSIUM CHLORIDE 10 MEQ/50ML IV SOLN
10.0000 meq | INTRAVENOUS | Status: AC
  Administered 2021-04-09 (×3): 10 meq via INTRAVENOUS
  Filled 2021-04-09 (×3): qty 50

## 2021-04-09 MED ORDER — INSULIN ASPART 100 UNIT/ML IJ SOLN
0.0000 [IU] | INTRAMUSCULAR | Status: DC
  Administered 2021-04-09 (×3): 2 [IU] via SUBCUTANEOUS

## 2021-04-09 MED ORDER — FE FUMARATE-B12-VIT C-FA-IFC PO CAPS
1.0000 | ORAL_CAPSULE | Freq: Two times a day (BID) | ORAL | Status: DC
  Administered 2021-04-09 – 2021-04-13 (×8): 1 via ORAL
  Filled 2021-04-09 (×10): qty 1

## 2021-04-09 MED FILL — Heparin Sodium (Porcine) Inj 1000 Unit/ML: INTRAMUSCULAR | Qty: 10 | Status: AC

## 2021-04-09 MED FILL — Magnesium Sulfate Inj 50%: INTRAMUSCULAR | Qty: 10 | Status: AC

## 2021-04-09 MED FILL — Lidocaine HCl Local Soln Prefilled Syringe 100 MG/5ML (2%): INTRAMUSCULAR | Qty: 5 | Status: AC

## 2021-04-09 MED FILL — Sodium Chloride IV Soln 0.9%: INTRAVENOUS | Qty: 2000 | Status: AC

## 2021-04-09 MED FILL — Mannitol IV Soln 20%: INTRAVENOUS | Qty: 500 | Status: AC

## 2021-04-09 MED FILL — Potassium Chloride Inj 2 mEq/ML: INTRAVENOUS | Qty: 40 | Status: AC

## 2021-04-09 MED FILL — Thrombin (Recombinant) For Soln 20000 Unit: CUTANEOUS | Qty: 1 | Status: AC

## 2021-04-09 MED FILL — Heparin Sodium (Porcine) Inj 1000 Unit/ML: INTRAMUSCULAR | Qty: 30 | Status: AC

## 2021-04-09 MED FILL — Sodium Bicarbonate IV Soln 8.4%: INTRAVENOUS | Qty: 50 | Status: AC

## 2021-04-09 MED FILL — Electrolyte-R (PH 7.4) Solution: INTRAVENOUS | Qty: 4000 | Status: AC

## 2021-04-09 NOTE — Progress Notes (Signed)
Patient ID: Andres Green, male   DOB: June 23, 1946, 75 y.o.   MRN: [id number]  TCTS Evening Rounds:   Hemodynamically stable. Atrial paced at 80.  Urine output adequate  CT output 200 cc since this am.  CBC    Component Value Date/Time   WBC 13.2 (H) 04/09/2021 0411   RBC 2.48 (L) 04/09/2021 0411   HGB 8.1 (L) 04/09/2021 0411   HGB 14.2 03/24/2021 1246   HCT 23.9 (L) 04/09/2021 0411   HCT 40.9 03/24/2021 1246   PLT 146 (L) 04/09/2021 0411   PLT 223 03/24/2021 1246   MCV 96.4 04/09/2021 0411   MCV 94 03/24/2021 1246   MCH 32.7 04/09/2021 0411   MCHC 33.9 04/09/2021 0411   RDW 12.1 04/09/2021 0411   RDW 12.6 03/24/2021 1246     BMET    Component Value Date/Time   NA 135 04/09/2021 0411   NA 141 03/24/2021 1246   K 4.0 04/09/2021 0411   CL 107 04/09/2021 0411   CO2 23 04/09/2021 0411   GLUCOSE 103 (H) 04/09/2021 0411   BUN 12 04/09/2021 0411   BUN 17 03/24/2021 1246   CREATININE 0.92 04/09/2021 0411   CALCIUM 7.3 (L) 04/09/2021 0411   GFRNONAA >60 04/09/2021 0411     A/P:  Stable postop course. Continue current plans. Will leave chest tubes in overnight.

## 2021-04-09 NOTE — Progress Notes (Signed)
1 Day Post-Op Procedure(s) (LRB): CORONARY ARTERY BYPASS GRAFTING (CABG)x6. LEFT INTERNAL MAMMARY ARTERY. RIGHT ENDOSCOPIC SAPHENOUS VEIN HARVESTING. (N/A) TRANSESOPHAGEAL ECHOCARDIOGRAM (TEE) (N/A) Subjective: No complaints  Objective: Vital signs in last 24 hours: Temp:  [96.44 F (35.8 C)-99.32 F (37.4 C)] 98.96 F (37.2 C) (05/20 0700) Pulse Rate:  [69-89] 80 (05/20 0700) Cardiac Rhythm: Atrial paced (05/20 0400) Resp:  [0-39] 21 (05/20 0700) BP: (88-123)/(52-91) 119/67 (05/20 0700) SpO2:  [99 %-100 %] 100 % (05/20 0700) Arterial Line BP: (73-157)/(44-82) 132/61 (05/20 0700) FiO2 (%):  [40 %-50 %] 40 % (05/19 2120) Weight:  [80.5 kg] 80.5 kg (05/20 0500)  Hemodynamic parameters for last 24 hours: PAP: (16-37)/(6-17) 22/9 CO:  [2.5 L/min-4022 L/min] 4022 L/min CI:  [1.3 L/min/m2-2.4 L/min/m2] 2.2 L/min/m2  Intake/Output from previous day: 05/19 0701 - 05/20 0700 In: 7251.9 [P.O.:100; I.V.:3331; Blood:1643.5; IV Piggyback:2177.4] Out: 4105 [Urine:2705; Chest Tube:1400] Intake/Output this shift: No intake/output data recorded.  General appearance: alert and cooperative Neurologic: intact Heart: regular rate and rhythm, S1, S2 normal, no murmur Lungs: clear to auscultation bilaterally Extremities: edema mild Wound: dressings dry  Lab Results: Recent Labs    04/08/21 2015 04/08/21 2146 04/08/21 2314 04/09/21 0411  WBC 12.9*  --   --  13.2*  HGB 9.3*   < > 8.2* 8.1*  HCT 27.2*   < > 24.0* 23.9*  PLT 143*  --   --  146*   < > = values in this interval not displayed.   BMET:  Recent Labs    04/08/21 2015 04/08/21 2146 04/08/21 2314 04/09/21 0411  NA 134*   < > 139 135  K 3.6   < > 3.8 4.0  CL 107  --   --  107  CO2 22  --   --  23  GLUCOSE 146*  --   --  103*  BUN 11  --   --  12  CREATININE 0.90  --   --  0.92  CALCIUM 7.3*  --   --  7.3*   < > = values in this interval not displayed.    PT/INR:  Recent Labs    04/08/21 1401  LABPROT 17.7*  INR  1.5*   ABG    Component Value Date/Time   PHART 7.371 04/08/2021 2314   HCO3 22.1 04/08/2021 2314   TCO2 23 04/08/2021 2314   ACIDBASEDEF 3.0 (H) 04/08/2021 2314   O2SAT 99.0 04/08/2021 2314   CBG (last 3)  Recent Labs    04/09/21 0419 04/09/21 0654 04/09/21 0756  GLUCAP 110* 124* 125*   CXR: mild atelectasis  ECG: sinus, normal  Assessment/Plan: S/P Procedure(s) (LRB): CORONARY ARTERY BYPASS GRAFTING (CABG)x6. LEFT INTERNAL MAMMARY ARTERY. RIGHT ENDOSCOPIC SAPHENOUS VEIN HARVESTING. (N/A) TRANSESOPHAGEAL ECHOCARDIOGRAM (TEE) (N/A)  POD 1 Hemodynamically stable in sinus rhythm. Just weaned off neo. Will hold beta blocker for now.  Chest tube output 1400 cc since OR but now thin serosanguinous. Will keep tubes in today.   Volume excess: wt 6 lbs over preop. Hold on diuresis until BP stable off neo.  Acute postop blood loss anemia: observe and start iron.  DC swan, arterial line.  OOB, IS.  No hx of DM but still on insulin drip at 1.8. Will give him a dose of Levemir this am and start SSI. Preop Hgb A1c was 5.6.   LOS: 1 day    Alleen Borne 04/09/2021

## 2021-04-10 ENCOUNTER — Inpatient Hospital Stay (HOSPITAL_COMMUNITY): Payer: Federal, State, Local not specified - PPO

## 2021-04-10 LAB — CBC
HCT: 23.8 % — ABNORMAL LOW (ref 39.0–52.0)
Hemoglobin: 7.9 g/dL — ABNORMAL LOW (ref 13.0–17.0)
MCH: 32.5 pg (ref 26.0–34.0)
MCHC: 33.2 g/dL (ref 30.0–36.0)
MCV: 97.9 fL (ref 80.0–100.0)
Platelets: 138 10*3/uL — ABNORMAL LOW (ref 150–400)
RBC: 2.43 MIL/uL — ABNORMAL LOW (ref 4.22–5.81)
RDW: 12.6 % (ref 11.5–15.5)
WBC: 11.5 10*3/uL — ABNORMAL HIGH (ref 4.0–10.5)
nRBC: 0 % (ref 0.0–0.2)

## 2021-04-10 LAB — BASIC METABOLIC PANEL
Anion gap: 4 — ABNORMAL LOW (ref 5–15)
BUN: 15 mg/dL (ref 8–23)
CO2: 26 mmol/L (ref 22–32)
Calcium: 7.6 mg/dL — ABNORMAL LOW (ref 8.9–10.3)
Chloride: 105 mmol/L (ref 98–111)
Creatinine, Ser: 0.97 mg/dL (ref 0.61–1.24)
GFR, Estimated: >60 mL/min (ref >60)
Glucose, Bld: 84 mg/dL (ref 70–99)
Potassium: 3.8 mmol/L (ref 3.5–5.1)
Sodium: 135 mmol/L (ref 135–145)

## 2021-04-10 LAB — GLUCOSE, CAPILLARY
Glucose-Capillary: 114 mg/dL — ABNORMAL HIGH (ref 70–99)
Glucose-Capillary: 90 mg/dL (ref 70–99)

## 2021-04-10 MED ORDER — ONDANSETRON HCL 4 MG/2ML IJ SOLN: 4.0000 mg | Freq: Four times a day (QID) | INTRAMUSCULAR | Status: DC | PRN

## 2021-04-10 MED ORDER — ACETAMINOPHEN 325 MG PO TABS
650.0000 mg | ORAL_TABLET | Freq: Four times a day (QID) | ORAL | Status: DC | PRN
  Administered 2021-04-11: 650 mg via ORAL
  Filled 2021-04-10: qty 2

## 2021-04-10 MED ORDER — ASPIRIN EC 81 MG PO TBEC
81.0000 mg | DELAYED_RELEASE_TABLET | Freq: Every day | ORAL | Status: DC
  Administered 2021-04-11 – 2021-04-13 (×3): 81 mg via ORAL
  Filled 2021-04-10 (×3): qty 1

## 2021-04-10 MED ORDER — ASPIRIN 81 MG PO CHEW: 81.0000 mg | CHEWABLE_TABLET | Freq: Every day | ORAL | Status: DC

## 2021-04-10 MED ORDER — ASPIRIN EC 81 MG PO TBEC
81.0000 mg | DELAYED_RELEASE_TABLET | Freq: Every day | ORAL | Status: DC
  Administered 2021-04-10: 81 mg via ORAL
  Filled 2021-04-10: qty 1

## 2021-04-10 MED ORDER — PANTOPRAZOLE SODIUM 40 MG PO TBEC
40.0000 mg | DELAYED_RELEASE_TABLET | Freq: Every day | ORAL | Status: DC
  Administered 2021-04-11 – 2021-04-13 (×3): 40 mg via ORAL
  Filled 2021-04-10 (×3): qty 1

## 2021-04-10 MED ORDER — SODIUM CHLORIDE 0.9% FLUSH
3.0000 mL | Freq: Two times a day (BID) | INTRAVENOUS | Status: DC
  Administered 2021-04-10 – 2021-04-13 (×7): 3 mL via INTRAVENOUS

## 2021-04-10 MED ORDER — ONDANSETRON HCL 4 MG PO TABS: 4.0000 mg | ORAL_TABLET | Freq: Four times a day (QID) | ORAL | Status: DC | PRN

## 2021-04-10 MED ORDER — SENNOSIDES-DOCUSATE SODIUM 8.6-50 MG PO TABS
1.0000 | ORAL_TABLET | Freq: Two times a day (BID) | ORAL | Status: DC | PRN
  Administered 2021-04-12: 1 via ORAL
  Filled 2021-04-10: qty 1

## 2021-04-10 MED ORDER — SODIUM CHLORIDE 0.9 % IV SOLN: 250.0000 mL | INTRAVENOUS | Status: DC | PRN

## 2021-04-10 MED ORDER — ATORVASTATIN CALCIUM 80 MG PO TABS
80.0000 mg | ORAL_TABLET | Freq: Every day | ORAL | Status: DC
  Administered 2021-04-10 – 2021-04-13 (×4): 80 mg via ORAL
  Filled 2021-04-10 (×4): qty 1

## 2021-04-10 MED ORDER — SODIUM CHLORIDE 0.9% FLUSH: 3.0000 mL | INTRAVENOUS | Status: DC | PRN

## 2021-04-10 MED ORDER — OXYCODONE HCL 5 MG PO TABS: 5.0000 mg | ORAL_TABLET | ORAL | Status: DC | PRN

## 2021-04-10 MED ORDER — ~~LOC~~ CARDIAC SURGERY, PATIENT & FAMILY EDUCATION: Freq: Once | Status: AC

## 2021-04-10 MED ORDER — TRAMADOL HCL 50 MG PO TABS: 50.0000 mg | ORAL_TABLET | Freq: Four times a day (QID) | ORAL | Status: DC | PRN

## 2021-04-10 NOTE — Progress Notes (Signed)
CARDIAC REHAB PHASE I   PRE:  Rate/Rhythm: 81 SR  BP:  Supine: 116/67  Sitting:   Standing:    SaO2: 95 RA  MODE:  Ambulation: 790 ft   POST:  Rate/Rhythm: 100 Sr  BP:  Supine:   Sitting: 138/71  Standing:    SaO2: 97 RA 1345-1425 Assisted X 1 to ambulate with hand held assist. Gait steady. Pt walked 790 feet without c/o. VS stable. Pt back to bed after walk with call light in reach. Pt is very motivated to wall. Melina Copa RN 04/10/2021 2:24 PM

## 2021-04-10 NOTE — Hospital Course (Signed)
History of Present Illness  The patient is a 75 year old gentleman with no prior cardiac history who reports developing fatigue and substernal chest discomfort radiating down his left arm while pushing a lawnmower a couple weeks ago.  This resolved with rest but he had a couple more episodes and presented to [facility] internal medicine in [facility] he lives.  He said that initial testing did not show anything.  He had further episodes and return to his PCP and was referred to cardiology.  He underwent a gated cardiac CTA on 03/22/2021 which showed a calcium score of 382 putting him in the 62nd percentile for his age and sex.  There was significant disease in the LAD and further FFR analysis showed a significant decrease in the mid and distal LAD of 0.53, 0.54, and 0.50.  The first diagonal also was abnormal at its bifurcation from the LAD with an FFR of 0.54.  The left main, left circumflex and right coronary artery were normal.  He subsequently underwent cardiac catheterization yesterday which showed severe three-vessel coronary disease with a 90% proximal LAD stenosis with 50% mid and distal stenoses..  The first diagonal had about 75% ostial stenosis.  The left circumflex had 75% proximal to mid stenosis with 80% stenosis and a second marginal branch.  The right coronary artery was diffusely irregular with 90% stenosis at the ostium of the PDA.  Left ventricular ejection fraction was 55 to 65%.  There is no aortic stenosis. Coronary artery bypass grafting was offered to the patient and he decided to proceed with surgery.   Hospital Course  Mr. Schwager was admitted for elective surgery on5/19/22 and taken to the OR where CABG x 6 was performed. Please see th operative note below. Following the procedure hs separated from cardiopulmonary bypass without difficulty and without any inotropic support.  He was transferred to the ICU in stable condition.  Hemodynamics remained stable. He was weaned  from the mechanical ventilator and extubated by 10pm on the day of surgery. On the following day, monitoring lines were removed routinely and he was mobilized. He was transferred to 4E on the following day. A beta blocker was not started initially due to relative hypotension. Diet and activity were advanced and well tolerated.

## 2021-04-10 NOTE — Progress Notes (Signed)
De-lined pt per orders. Removed foley, uncomplicated. Removed chest tubes x3, uncomplicated and tied existing sutures closed. Removed introducer, uncomplicated without evidence of hematoma. Epicardial wires disconnected and taped to chest. Pt will remain in bed for 60 minutes. VSS on RA.   Jovan Schickling RN

## 2021-04-10 NOTE — Progress Notes (Signed)
Transferred patient to 4E-01, walking. VSS on tele, on RA. Called Tobi Bastos, wife, to update about transfer. Pt settled on 4E with Susa Raring and CNA.   Malee Grays RN

## 2021-04-10 NOTE — Progress Notes (Signed)
2 Days Post-Op Procedure(s) (LRB): CORONARY ARTERY BYPASS GRAFTING (CABG)x6. LEFT INTERNAL MAMMARY ARTERY. RIGHT ENDOSCOPIC SAPHENOUS VEIN HARVESTING. (N/A) TRANSESOPHAGEAL ECHOCARDIOGRAM (TEE) (N/A) Subjective: No complaints. Ate breakfast, ambulated.  Objective: Vital signs in last 24 hours: Temp:  [98.1 F (36.7 C)-100.4 F (38 C)] 98.1 F (36.7 C) (05/21 0030) Pulse Rate:  [79-92] 87 (05/21 0722) Cardiac Rhythm: Atrial paced (05/21 0000) Resp:  [0-28] 24 (05/21 0722) BP: (88-117)/(54-73) 117/65 (05/21 0722) SpO2:  [92 %-100 %] 96 % (05/21 0722) Arterial Line BP: (118)/(53) 118/53 (05/20 0800) Weight:  [77.1 kg] 77.1 kg (05/21 0500)  Hemodynamic parameters for last 24 hours: PAP: (28)/(9) 28/9 CO:  [4 L/min] 4 L/min CI:  [2.2 L/min/m2] 2.2 L/min/m2  Intake/Output from previous day: 05/20 0701 - 05/21 0700 In: 1136.8 [P.O.:240; I.V.:417.3; IV Piggyback:479.6] Out: 2060 [Urine:1660; Chest Tube:400] Intake/Output this shift: No intake/output data recorded.  General appearance: alert and cooperative Neurologic: intact Heart: regular rate and rhythm, S1, S2 normal, no murmur Lungs: clear to auscultation bilaterally Extremities: no edema Wound: dressings dry  Lab Results: Recent Labs    04/09/21 1600 04/10/21 0436  WBC 13.0* 11.5*  HGB 8.1* 7.9*  HCT 24.8* 23.8*  PLT 150 138*   BMET:  Recent Labs    04/09/21 1600 04/10/21 0436  NA 134* 135  K 3.7 3.8  CL 105 105  CO2 24 26  GLUCOSE 149* 84  BUN 13 15  CREATININE 1.00 0.97  CALCIUM 7.6* 7.6*    PT/INR:  Recent Labs    04/08/21 1401  LABPROT 17.7*  INR 1.5*   ABG    Component Value Date/Time   PHART 7.371 04/08/2021 2314   HCO3 22.1 04/08/2021 2314   TCO2 23 04/08/2021 2314   ACIDBASEDEF 3.0 (H) 04/08/2021 2314   O2SAT 99.0 04/08/2021 2314   CBG (last 3)  Recent Labs    04/09/21 2009 04/10/21 0028 04/10/21 0432  GLUCAP 138* 114* 90   CXR: clear  Assessment/Plan: S/P Procedure(s)  (LRB): CORONARY ARTERY BYPASS GRAFTING (CABG)x6. LEFT INTERNAL MAMMARY ARTERY. RIGHT ENDOSCOPIC SAPHENOUS VEIN HARVESTING. (N/A) TRANSESOPHAGEAL ECHOCARDIOGRAM (TEE) (N/A)  POD 2 Hemodynamically stable in sinus rhythm. BP low normal so will hold off on beta blocker for now.  Volume excess: diuresed well yesterday. Wt is below preop.  Acute postop blood loss anemia: Hgb stable. On iron.   Glucose under good control with no DM. Stop CBG's.  DC chest tubes, sleeve and foley.  Transfer to 4E and continue mobilization.     LOS: 2 days    Alleen Borne 04/10/2021

## 2021-04-11 ENCOUNTER — Inpatient Hospital Stay (HOSPITAL_COMMUNITY): Payer: Federal, State, Local not specified - PPO

## 2021-04-11 LAB — CBC
HCT: 22 % — ABNORMAL LOW (ref 39.0–52.0)
Hemoglobin: 7.3 g/dL — ABNORMAL LOW (ref 13.0–17.0)
MCH: 32.2 pg (ref 26.0–34.0)
MCHC: 33.2 g/dL (ref 30.0–36.0)
MCV: 96.9 fL (ref 80.0–100.0)
Platelets: 136 10*3/uL — ABNORMAL LOW (ref 150–400)
RBC: 2.27 MIL/uL — ABNORMAL LOW (ref 4.22–5.81)
RDW: 12.6 % (ref 11.5–15.5)
WBC: 10 10*3/uL (ref 4.0–10.5)
nRBC: 0 % (ref 0.0–0.2)

## 2021-04-11 MED ORDER — GUAIFENESIN ER 600 MG PO TB12
600.0000 mg | ORAL_TABLET | Freq: Two times a day (BID) | ORAL | Status: DC
  Administered 2021-04-11 – 2021-04-13 (×5): 600 mg via ORAL
  Filled 2021-04-11 (×5): qty 1

## 2021-04-11 NOTE — Progress Notes (Signed)
Pacing wires removed per order and protocol. Patient tolerated procedure well. VS q15 x 4 and bedrest 1 hr.

## 2021-04-11 NOTE — Progress Notes (Signed)
Mobility Specialist: Progress Note   04/11/21 1216  Mobility  Activity Ambulated to bathroom;Ambulated in hall  Level of Assistance Independent  Assistive Device None  Distance Ambulated (ft) 470 ft  Mobility Ambulated independently in hallway  Mobility Response Tolerated well  Mobility performed by Mobility specialist  Bed Position Chair  $Mobility charge 1 Mobility   Pre-Mobility: 89 HR During Mobility: 115 HR Post-Mobility: 99 HR, 137/67 BP, 96% SpO2  Pt to BR and then agreeable to ambulate. Pt asx during ambulation. Pt back to bed after walk with call bell at his side.   [facility] Hollyann Pablo Mobility Specialist Mobility Specialist Phone: [phone]

## 2021-04-11 NOTE — Progress Notes (Addendum)
3 Days Post-Op Procedure(s) (LRB): CORONARY ARTERY BYPASS GRAFTING (CABG)x6. LEFT INTERNAL MAMMARY ARTERY. RIGHT ENDOSCOPIC SAPHENOUS VEIN HARVESTING. (N/A) TRANSESOPHAGEAL ECHOCARDIOGRAM (TEE) (N/A) Subjective: Up in the bedside chair, complains of sinus drainage and phlegm production that is difficult to clear.  Tolerating PO, passing gas, no BM yet.   Objective: Vital signs in last 24 hours: Temp:  [97.9 F (36.6 C)-100.6 F (38.1 C)] 99 F (37.2 C) (05/22 0800) Pulse Rate:  [76-87] 81 (05/22 0800) Cardiac Rhythm: Normal sinus rhythm (05/22 0725) Resp:  [14-26] 20 (05/22 0800) BP: (100-137)/(43-79) 108/72 (05/22 0800) SpO2:  [94 %-100 %] 98 % (05/22 0800) FiO2 (%):  [21 %] 21 % (05/21 1056) Weight:  [77.6 kg] 77.6 kg (05/22 0543)     Intake/Output from previous day: 05/21 0701 - 05/22 0700 In: 261.6 [P.O.:240; IV Piggyback:21.6] Out: 545 [Urine:525; Chest Tube:20] Intake/Output this shift: No intake/output data recorded.  General appearance: alert and cooperative Neurologic: intact Heart: regular rate and rhythm, no arrhythmias recorded Lungs: clear to auscultation bilaterally Extremities: no edema Wound: dressings removed. The sternal incision and RLE EVH incisions are dry  Lab Results: Recent Labs    04/10/21 0436 04/11/21 0130  WBC 11.5* 10.0  HGB 7.9* 7.3*  HCT 23.8* 22.0*  PLT 138* 136*   BMET:  Recent Labs    04/09/21 1600 04/10/21 0436  NA 134* 135  K 3.7 3.8  CL 105 105  CO2 24 26  GLUCOSE 149* 84  BUN 13 15  CREATININE 1.00 0.97  CALCIUM 7.6* 7.6*    PT/INR:  Recent Labs    04/08/21 1401  LABPROT 17.7*  INR 1.5*   ABG    Component Value Date/Time   PHART 7.371 04/08/2021 2314   HCO3 22.1 04/08/2021 2314   TCO2 23 04/08/2021 2314   ACIDBASEDEF 3.0 (H) 04/08/2021 2314   O2SAT 99.0 04/08/2021 2314   CBG (last 3)  Recent Labs    04/09/21 2009 04/10/21 0028 04/10/21 0432  GLUCAP 138* 114* 90   CXR:  clear  Assessment/Plan: S/P Procedure(s) (LRB): CORONARY ARTERY BYPASS GRAFTING (CABG)x6. LEFT INTERNAL MAMMARY ARTERY. RIGHT ENDOSCOPIC SAPHENOUS VEIN HARVESTING. (N/A) TRANSESOPHAGEAL ECHOCARDIOGRAM (TEE) (N/A)  -POD 3 CABG x 6, normal EF. Progressing well overall, maintaining SR. BP less than 110 this am, holding B. Blocker for now.  Continue ASA, statin  -Expected acute blood loss anemia: Hgb trending down at 7.3 this morning. He is tolerating this OK. Will continue iron supplement, monitor.   -Volume excess: resolved. Not on Lasix   -Add Mucinex to help clear secretion.      LOS: 3 days    Parke Poisson [phone] 04/11/2021   Chart reviewed, patient examined, agree with above. Will repeat Hgb in am.

## 2021-04-11 NOTE — Discharge Summary (Signed)
Physician Discharge Summary  Patient ID: Andres Green MRN: [id number] DOB/AGE: [birth year] 74 y.o.  Admit date: 04/08/2021 Discharge date: 04/13/2021  Admission Diagnoses:  Multi-vessel coronary artery disease Angina pectoris    Discharge Diagnoses:   Multi-vessel coronary artery disease Angina pectoris S/P CABG x 6 Expected acute blood loss anemia    Discharged Condition: stable  History of Present Illness  The patient is a 75 year old gentleman with no prior cardiac history who reports developing fatigue and substernal chest discomfort radiating down his left arm while pushing a lawnmower a couple weeks ago.  This resolved with rest but he had a couple more episodes and presented to [facility] medicine [facility].  He said that initial testing did not show anything.  He had further episodes and return to his PCP and was referred to cardiology.  He underwent a gated cardiac CTA on 03/22/2021 which showed a calcium score of 382 putting him in the 62nd percentile for his age and sex.  There was significant disease in the LAD and further FFR analysis showed a significant decrease in the mid and distal LAD of 0.53, 0.54, and 0.50.  The first diagonal also was abnormal at its bifurcation from the LAD with an FFR of 0.54.  The left main, left circumflex and right coronary artery were normal.  He subsequently underwent cardiac catheterization yesterday which showed severe three-vessel coronary disease with a 90% proximal LAD stenosis with 50% mid and distal stenoses..  The first diagonal had about 75% ostial stenosis.  The left circumflex had 75% proximal to mid stenosis with 80% stenosis and a second marginal branch.  The right coronary artery was diffusely irregular with 90% stenosis at the ostium of the PDA.  Left ventricular ejection fraction was 55 to 65%.  There is no aortic stenosis. Coronary artery bypass grafting was offered to the patient and he decided  to proceed with surgery.   Hospital Course  Mr. Zatarain was admitted for elective surgery on 04/08/21 and taken to the OR where CABG x 6 was performed. Please see th operative note below. Following the procedure hs separated from cardiopulmonary bypass without difficulty and without any inotropic support.  He was transferred to the ICU in stable condition.  Hemodynamics remained stable. He was weaned from the mechanical ventilator and extubated by 10pm on the day of surgery. On the following day, monitoring lines were removed routinely and he was mobilized. He was transferred to 4E on the following day. A beta blocker was not started initially due to relative hypotension but by the 4th post-op day his SBP was sufficient to allow low-dose metoprolol. Diet and activity were advanced and well tolerated. He had and expected acute blood loss anemia that was treated with iron supplementation and the Hct was monitored. He had no evidence of active blood loss and tolerated the anemia without any problems. He remoined in a stable sinus rhythm. He was ready for discharge to home on 04/13/21.   Consults: None  Significant Diagnostic Studies:  EXAM: CHEST - 2 VIEW  COMPARISON:  Apr 10, 2021  FINDINGS: Support apparatus is been removed. No pneumothorax. The heart, hila, mediastinum, and pleura are unchanged. Tiny pleural effusions. Mild atelectasis in the left base. No other acute abnormalities.  IMPRESSION: Tiny pleural effusions. Mild atelectasis in the left base. No other acute abnormalities.   Electronically Signed   By: Gerome Sam III M.D   On: 04/11/2021 07:10   Treatments:  CARDIOVASCULAR SURGERY OPERATIVE NOTE  04/08/2021  Surgeon:  Alleen Borne, MD  First Assistant: Jillyn Hidden,  PA-C   Preoperative Diagnosis:  Severe multi-vessel coronary artery disease   Postoperative Diagnosis:  Same   Procedure:  1. Median Sternotomy 2. Extracorporeal  circulation 3.   Coronary artery bypass grafting x 6   Left internal mammary artery graft to the LAD  SVG to diagonal 1  Sequential SVG to OM1 and OM2  Sequential SVG to PDA and PL (RCA)   4.   Endoscopic vein harvest from the right leg   Anesthesia:  General Endotracheal   Discharge Exam: Blood pressure 112/69, pulse 69, temperature 98.4 F (36.9 C), temperature source Oral, resp. rate 20, height 5\' 9"  (1.753 m), weight 76.5 kg, SpO2 98 %.  General appearance: alert and cooperative Neurologic: intact Heart: regular rate and rhythm, no arrhythmias recorded Lungs: breath sounds clear Extremities: no edema Wound:  The sternal incision and RLE EVH incisions remain dry  Disposition:  Discharged to home in stable condition.         Allergies as of 04/13/2021   No Known Allergies     Medication List    STOP taking these medications   isosorbide mononitrate 30 MG 24 hr tablet Commonly known as: IMDUR     TAKE these medications   acetaminophen 325 MG tablet Commonly known as: TYLENOL Take 2 tablets (650 mg total) by mouth every 6 (six) hours as needed for mild pain. What changed:   medication strength  how much to take  reasons to take this   aspirin EC 81 MG tablet Take 1 tablet (81 mg total) by mouth daily. Swallow whole. What changed: when to take this   atorvastatin 80 MG tablet Commonly known as: LIPITOR Take 1 tablet (80 mg total) by mouth daily.   calamine lotion Apply 1 application topically as needed (poison ivy rash).   ferrous fumarate-b12-vitamic C-folic acid capsule Commonly known as: TRINSICON / FOLTRIN Take 1 capsule by mouth 2 (two) times daily after a meal.   metoprolol tartrate 25 MG tablet Commonly known as: LOPRESSOR Take 0.5 tablets (12.5 mg total) by mouth 2 (two) times daily.   nitroGLYCERIN 0.4 MG SL tablet Commonly known as: NITROSTAT Place 1 tablet (0.4 mg total) under the tongue every 5 (five) minutes as needed  for chest pain. What changed: when to take this   traMADol 50 MG tablet Commonly known as: ULTRAM Take 1 tablet (50 mg total) by mouth every 6 (six) hours as needed for up to 5 days for moderate pain.       Follow-up Information    [facility] [place]. Go on 04/21/2021.   Specialty: Cardiothoracic Surgery Why: Your appointment is at 11:30 for suture removal. Contact information: [address] [place], [address] [place] [phone]       [phone], NP. Go on 05/07/2021.   Specialty: Cardiology Why: Your appointment is at 2:15pm. Contact information: [address] [place] [phone]        [phone], MD. Go on 05/19/2021.   Specialty: Cardiothoracic Surgery Why: Your appointment is at 12:30. Please arrive 30 minutes early for a chest x-ray to be performed by [facility] located on the first floor of the same building.  Contact information: [address] [place] [phone]  The patient has been discharged on:   1.Beta Blocker:  Yes [place]ienne ]                              No   [   ]                              If No, reason:  2.Ace Inhibitor/ARB: Yes [   ]                                     No  [ x ]                                     If No, reason: Low BP  3.Statin:   Yes [ x ]                  No  [   ]                  If No, reason:  4.Ecasa:  Yes  [ x ]                  No   [   ]                  If No, reason:     Signed: Leary Roca, PA-C 04/13/2021, 8:10 AM

## 2021-04-12 LAB — CBC
HCT: 22.8 % — ABNORMAL LOW (ref 39.0–52.0)
Hemoglobin: 7.7 g/dL — ABNORMAL LOW (ref 13.0–17.0)
MCH: 32.9 pg (ref 26.0–34.0)
MCHC: 33.8 g/dL (ref 30.0–36.0)
MCV: 97.4 fL (ref 80.0–100.0)
Platelets: 153 10*3/uL (ref 150–400)
RBC: 2.34 MIL/uL — ABNORMAL LOW (ref 4.22–5.81)
RDW: 12.6 % (ref 11.5–15.5)
WBC: 8.6 10*3/uL (ref 4.0–10.5)
nRBC: 0 % (ref 0.0–0.2)

## 2021-04-12 MED ORDER — METOPROLOL TARTRATE 12.5 MG HALF TABLET
12.5000 mg | ORAL_TABLET | Freq: Two times a day (BID) | ORAL | Status: DC
  Administered 2021-04-12 – 2021-04-13 (×3): 12.5 mg via ORAL
  Filled 2021-04-12 (×3): qty 1

## 2021-04-12 NOTE — Progress Notes (Addendum)
4 Days Post-Op Procedure(s) (LRB): CORONARY ARTERY BYPASS GRAFTING (CABG)x6. LEFT INTERNAL MAMMARY ARTERY. RIGHT ENDOSCOPIC SAPHENOUS VEIN HARVESTING. (N/A) TRANSESOPHAGEAL ECHOCARDIOGRAM (TEE) (N/A) Subjective:  Feeling better, no new concerns. Says sinus drainage and phlegm production improved. Walked several times in the hall. Denies shortness of breath on RA.  Small BM.   Objective: Vital signs in last 24 hours: Temp:  [98.4 F (36.9 C)-99.1 F (37.3 C)] 98.4 F (36.9 C) (05/23 0436) Pulse Rate:  [72-85] 72 (05/23 0436) Cardiac Rhythm: Normal sinus rhythm (05/22 2100) Resp:  [16-20] 19 (05/23 0436) BP: (108-147)/(62-80) 124/75 (05/23 0436) SpO2:  [97 %-100 %] 100 % (05/23 0436) Weight:  [76.7 kg] 76.7 kg (05/23 0436)     Intake/Output from previous day: 05/22 0701 - 05/23 0700 In: 600 [P.O.:600] Out: 2 [Urine:2] Intake/Output this shift: No intake/output data recorded.  General appearance: alert and cooperative Neurologic: intact Heart: regular rate and rhythm, no arrhythmias recorded Lungs: breath sounds clear Extremities: no edema Wound:  The sternal incision and RLE EVH incisions remain dry  Lab Results: Recent Labs    04/11/21 0130 04/12/21 0144  WBC 10.0 8.6  HGB 7.3* 7.7*  HCT 22.0* 22.8*  PLT 136* 153   BMET:  Recent Labs    04/09/21 1600 04/10/21 0436  NA 134* 135  K 3.7 3.8  CL 105 105  CO2 24 26  GLUCOSE 149* 84  BUN 13 15  CREATININE 1.00 0.97  CALCIUM 7.6* 7.6*    PT/INR:  No results for input(s): LABPROT, INR in the last 72 hours. ABG    Component Value Date/Time   PHART 7.371 04/08/2021 2314   HCO3 22.1 04/08/2021 2314   TCO2 23 04/08/2021 2314   ACIDBASEDEF 3.0 (H) 04/08/2021 2314   O2SAT 99.0 04/08/2021 2314   CBG (last 3)  Recent Labs    04/09/21 2009 04/10/21 0028 04/10/21 0432  GLUCAP 138* 114* 90   CXR: clear  Assessment/Plan: S/P Procedure(s) (LRB): CORONARY ARTERY BYPASS GRAFTING (CABG)x6. LEFT INTERNAL  MAMMARY ARTERY. RIGHT ENDOSCOPIC SAPHENOUS VEIN HARVESTING. (N/A) TRANSESOPHAGEAL ECHOCARDIOGRAM (TEE) (N/A)  -POD 4 CABG x 6, normal EF. Progressing well overall, maintaining SR. BP 110-120's. Beta blocker has been on hold due to relative hypotension. May consider starting at low-dose. Continue ASA, statin  -Expected acute blood loss anemia: Hgb is stable, slight up-trend.  He is tolerating this OK. Will continue iron supplement, monitor.   -Disposition- Progressing well, plan for discharge to home tomorrow.   LOS: 4 days    Leary Roca, PA-C [phone] 04/12/2021  . Chart reviewed, patient examined, agree with above. He feels well. Hgb up slightly. Continue iron. BP ok to start low dose Lopressor. Plan home tomorrow if no changes.

## 2021-04-12 NOTE — Progress Notes (Signed)
Mobility Specialist: Progress Note   04/12/21 1051  Mobility  Activity Ambulated in hall  Level of Assistance Independent  Assistive Device Front wheel walker  Distance Ambulated (ft) 790 ft  Mobility Ambulated independently in hallway  Mobility Response Tolerated well  Mobility performed by Mobility specialist  $Mobility charge 1 Mobility   Post-Mobility: 99 HR  Pt asx during ambulation. Pt was independent but said he prefers to use RW and did with cardiac rehab this morning. Pt back to bed per request after walk.   [facility] Andres Green Mobility Specialist Mobility Specialist Phone: [phone]

## 2021-04-12 NOTE — Progress Notes (Signed)
CARDIAC REHAB PHASE I   PRE:  Rate/Rhythm: 81 SR  BP:  Sitting: 94/64      SaO2: 97 RA  MODE:  Ambulation: 1200 ft   POST:  Rate/Rhythm: 102 ST  BP:  Sitting: 128/65    SaO2: 98 RA   Pt ambulated 1232ft in hallway independently with front wheel walker. Pt states some dizziness upon rising, but resolved quickly. Pt returned to bed. Encouraged continued IS use and walks. Told pt he can walk independently if he stays on the unit. Pt denies questions or concerns at this time. Will continue to follow.  [id number] Reynold Bowen, RN BSN 04/12/2021 9:01 AM

## 2021-04-13 MED ORDER — METOPROLOL TARTRATE 25 MG PO TABS: 12.5000 mg | ORAL_TABLET | Freq: Two times a day (BID) | ORAL | 2 refills | Status: DC

## 2021-04-13 MED ORDER — ATORVASTATIN CALCIUM 80 MG PO TABS: 80.0000 mg | ORAL_TABLET | Freq: Every day | ORAL | 2 refills | Status: DC

## 2021-04-13 MED ORDER — ACETAMINOPHEN 325 MG PO TABS: 650.0000 mg | ORAL_TABLET | Freq: Four times a day (QID) | ORAL | Status: DC | PRN

## 2021-04-13 MED ORDER — TRAMADOL HCL 50 MG PO TABS: 50.0000 mg | ORAL_TABLET | Freq: Four times a day (QID) | ORAL | 0 refills | Status: AC | PRN

## 2021-04-13 MED ORDER — FE FUMARATE-B12-VIT C-FA-IFC PO CAPS: 1.0000 | ORAL_CAPSULE | Freq: Two times a day (BID) | ORAL | 1 refills | Status: DC

## 2021-04-13 NOTE — Progress Notes (Signed)
Mobility Specialist: Progress Note   04/13/21 1044  Mobility  Activity Ambulated in hall  Level of Assistance Independent  Assistive Device None  Distance Ambulated (ft) 1500 ft  Mobility Ambulated independently in hallway  Mobility Response Tolerated well  Mobility performed by Mobility specialist  $Mobility charge 1 Mobility   Pt asx during ambulation. Pt back to bed after walk, call bell at his side.   [facility] Tytianna Greenley Mobility Specialist Mobility Specialist Phone: [phone]

## 2021-04-13 NOTE — Plan of Care (Signed)
  Problem: Education: Goal: Will demonstrate proper wound care and an understanding of methods to prevent future damage Outcome: Adequate for Discharge Goal: Knowledge of disease or condition will improve Outcome: Adequate for Discharge Goal: Knowledge of the prescribed therapeutic regimen will improve Outcome: Adequate for Discharge Goal: Individualized Educational Video(s) Outcome: Adequate for Discharge   

## 2021-04-13 NOTE — Progress Notes (Addendum)
5 Days Post-Op Procedure(s) (LRB): CORONARY ARTERY BYPASS GRAFTING (CABG)x6. LEFT INTERNAL MAMMARY ARTERY. RIGHT ENDOSCOPIC SAPHENOUS VEIN HARVESTING. (N/A) TRANSESOPHAGEAL ECHOCARDIOGRAM (TEE) (N/A) Subjective:  Feels good, no concerns, said he is ready to return home.   Walking independently and maintaining good O2 sats on RA.  "Normal" BM yesterday.    Objective: Vital signs in last 24 hours: Temp:  [98.4 F (36.9 C)-99.5 F (37.5 C)] 98.4 F (36.9 C) (05/24 0403) Pulse Rate:  [69-95] 69 (05/24 0403) Cardiac Rhythm: Normal sinus rhythm (05/23 1900) Resp:  [12-20] 12 (05/24 0403) BP: (115-128)/(65-79) 121/79 (05/24 0403) SpO2:  [95 %-99 %] 96 % (05/24 0403) Weight:  [76.5 kg] 76.5 kg (05/24 0403)     Intake/Output from previous day: 05/23 0701 - 05/24 0700 In: 120 [P.O.:120] Out: 1025 [Urine:1025] Intake/Output this shift: No intake/output data recorded.  General appearance: alert and cooperative Neurologic: intact Heart: regular rate and rhythm, no arrhythmias recorded Lungs: breath sounds clear Extremities: no edema Wound:  The sternal incision and RLE EVH incisions remain dry  Lab Results: Recent Labs    04/11/21 0130 04/12/21 0144  WBC 10.0 8.6  HGB 7.3* 7.7*  HCT 22.0* 22.8*  PLT 136* 153   BMET:  No results for input(s): NA, K, CL, CO2, GLUCOSE, BUN, CREATININE, CALCIUM in the last 72 hours.  PT/INR:  No results for input(s): LABPROT, INR in the last 72 hours. ABG    Component Value Date/Time   PHART 7.371 04/08/2021 2314   HCO3 22.1 04/08/2021 2314   TCO2 23 04/08/2021 2314   ACIDBASEDEF 3.0 (H) 04/08/2021 2314   O2SAT 99.0 04/08/2021 2314   CBG (last 3)  No results for input(s): GLUCAP in the last 72 hours. CXR: clear  Assessment/Plan: S/P Procedure(s) (LRB): CORONARY ARTERY BYPASS GRAFTING (CABG)x6. LEFT INTERNAL MAMMARY ARTERY. RIGHT ENDOSCOPIC SAPHENOUS VEIN HARVESTING. (N/A) TRANSESOPHAGEAL ECHOCARDIOGRAM (TEE) (N/A)  -POD 5 CABG x  6, normal EF. Progressing well overall, maintaining SR. BP 110-120's. BP tolerating addition of metoprolol yesterday.  Continue ASA, statin  -Expected acute blood loss anemia: Hgb has been stable, no new lab toda.  He is tolerating this OK. Will continue iron supplement.   -Disposition- Progressing well, plan for discharge to home today on ASA, statin, BB.   LOS: 5 days    Leary Roca, PA-C [phone] 04/13/2021   Chart reviewed, patient examined, agree with above. He looks good, bowels moving. Ambulating well. Ready to go home.

## 2021-04-13 NOTE — Progress Notes (Signed)
Discharge instructions (including medications) discussed with and copy provided to patient/caregiver 

## 2021-04-13 NOTE — Progress Notes (Signed)
CARDIAC REHAB PHASE I   D/c education completed with pt. Pt educated on importance of site care and monitoring incisions daily. Encouraged continued IS use, walks, and sternal precautions. Pt given in-the-tube sheet and heart healthy diet. Reviewed restrictions and exercise guidelines. Will refer to CRP II [place].  [id number] Reynold Bowen, RN BSN 04/13/2021 9:01 AM

## 2021-04-13 NOTE — Discharge Instructions (Signed)

## 2021-04-16 ENCOUNTER — Telehealth (HOSPITAL_COMMUNITY): Payer: Self-pay

## 2021-04-16 NOTE — Telephone Encounter (Signed)
Called patient to see if he is interested in the Cardiac Rehab Program. Patient expressed interest. Explained scheduling process and went over insurance, patient verbalized understanding. Will contact patient for scheduling once f/u has been completed.  °

## 2021-04-16 NOTE — Telephone Encounter (Signed)
Pt insurance is active and benefits verified through [place]. Co-pay $0.00, DED $350.00/$77.73 met, out of pocket $6,000.00/$820.18 met, co-insurance 35%. No pre-authorization required. Passport, 04/16/21 @ 1:40PM, SZJ#[id number]  Will contact patient to see if he is interested in the Cardiac Rehab Program. If interested, patient will need to complete follow up appt. Once completed, patient will be contacted for scheduling upon review by the RN Navigator.

## 2021-04-21 ENCOUNTER — Ambulatory Visit (INDEPENDENT_AMBULATORY_CARE_PROVIDER_SITE_OTHER): Payer: Self-pay | Admitting: *Deleted

## 2021-04-21 ENCOUNTER — Other Ambulatory Visit: Payer: Self-pay

## 2021-04-21 DIAGNOSIS — Z4802 Encounter for removal of sutures: Secondary | ICD-10-CM

## 2021-04-21 NOTE — Progress Notes (Signed)
Patient arrived for nurse visit to remove sutures post-CABG 04/08/2021 by Dr. Laneta Simmers.  Three sutures removed with no signs or symptoms of infection noted. Incisions well approximated. Patient tolerated suture removal well.  Patient asked about receiving second Covid booster. Advised patient to wait a few more weeks to talk to provider about receiving the shot. Patient and family instructed to keep the incision site clean and dry. Patient and family acknowledged instructions given.  All questions answered.

## 2021-04-28 ENCOUNTER — Telehealth: Payer: Self-pay

## 2021-04-28 NOTE — Telephone Encounter (Signed)
-----   Message from Alleen Borne, MD sent at 04/28/2021 12:01 PM EDT ----- Regarding: RE: question from dentist [phone] Contact: [phone] Either is fine. Thx! ----- Message ----- From: Joycelyn Schmid, LPN Sent: 02/21/5955  10:50 AM EDT To: Alleen Borne, MD Subject: question from dentist [phone]      Patient is needing a emergent root canal. He is S/p CABG. The dentist is needing to know should he have a local with or without epinephrine.?? Please advise Roselyn Bering

## 2021-05-05 ENCOUNTER — Other Ambulatory Visit: Payer: Self-pay | Admitting: Physician Assistant

## 2021-05-07 ENCOUNTER — Other Ambulatory Visit: Payer: Self-pay

## 2021-05-07 ENCOUNTER — Encounter: Payer: Self-pay | Admitting: Family

## 2021-05-07 ENCOUNTER — Ambulatory Visit: Payer: Federal, State, Local not specified - PPO | Admitting: Family

## 2021-05-07 VITALS — BP 100/70 | HR 67 | Ht 69.0 in | Wt 164.0 lb

## 2021-05-07 DIAGNOSIS — D649 Anemia, unspecified: Secondary | ICD-10-CM | POA: Diagnosis not present

## 2021-05-07 DIAGNOSIS — E785 Hyperlipidemia, unspecified: Secondary | ICD-10-CM | POA: Diagnosis not present

## 2021-05-07 DIAGNOSIS — I25118 Atherosclerotic heart disease of native coronary artery with other forms of angina pectoris: Secondary | ICD-10-CM

## 2021-05-07 DIAGNOSIS — Z951 Presence of aortocoronary bypass graft: Secondary | ICD-10-CM | POA: Diagnosis not present

## 2021-05-07 MED ORDER — METOPROLOL TARTRATE 25 MG PO TABS: 12.5000 mg | ORAL_TABLET | Freq: Two times a day (BID) | ORAL | 1 refills | Status: DC

## 2021-05-07 MED ORDER — ATORVASTATIN CALCIUM 80 MG PO TABS: 80.0000 mg | ORAL_TABLET | Freq: Every day | ORAL | 1 refills | Status: DC

## 2021-05-07 NOTE — Progress Notes (Signed)
Office Visit    Patient Name: Andres Green Date of Encounter: 05/07/2021  PCP:  Verl Blalock   [facility]  Cardiologist:  Lance Muss, MD  Advanced Practice Provider:  No care team member to display Electrophysiologist:  None    Chief Complaint    Andres Green is a 75 y.o. male with a hx of CAD s/p CABGx6 03/2018, HLD presents today for follow up after CABG.   Past Medical History    Past Medical History:  Diagnosis Date   Anginal pain (HCC)    Chest pain    Coronary artery disease    s/p CABG x6 04/08/21   Fatigue    Hyperlipidemia LDL goal <70    Left shoulder pain    Past Surgical History:  Procedure Laterality Date   CARDIAC CATHETERIZATION     03/30/21   CORONARY ARTERY BYPASS GRAFT N/A 04/08/2021   Procedure: CORONARY ARTERY BYPASS GRAFTING (CABG)x6. LEFT INTERNAL MAMMARY ARTERY. RIGHT ENDOSCOPIC SAPHENOUS VEIN HARVESTING.;  Surgeon: Alleen Borne, MD;  Location: [facility] OR;  Service: Open Heart Surgery;  Laterality: N/A;   EYE SURGERY Bilateral    CATARACT SX, WITH LENS PLACEMENT   LEFT HEART CATH AND CORONARY ANGIOGRAPHY N/A 03/30/2021   Procedure: LEFT HEART CATH AND CORONARY ANGIOGRAPHY;  Surgeon: Corky Crafts, MD;  Location: [facility] INVASIVE CV LAB;  Service: Cardiovascular;  Laterality: N/A;   TEE WITHOUT CARDIOVERSION N/A 04/08/2021   Procedure: TRANSESOPHAGEAL ECHOCARDIOGRAM (TEE);  Surgeon: Alleen Borne, MD;  Location: [facility] OR;  Service: Open Heart Surgery;  Laterality: N/A;   WIDSOM TOOTH REMOVAL      Allergies  No Known Allergies  History of Present Illness    Andres Green is a 75 y.o. male with a hx of CAD s/p CABGx6 03/2018, HLD last seen while hospitalized.  Initially evaluated 02/2021 due to chest pain by Dr. Jiles Garter after referral from his primary care provider. Gated cardiac CTA 03/23/2019 calcium score of 392 placing the 62nd percentile for age and sex will significant disease in the LAD and  further FFR analysis showing significant disease in mid and distal LAD.  Cardiac catheterization 03/30/2021 with three-vessel coronary disease 90% proximal LAD, 50% mid and distal LAD, first diagonal 75% ostial, left circumflex 75% proximal to mid stenosis with 80% stenosis at second marginal branch, RCA diffusely irregular with 90% stenosis the ostium of the PDA.  LVEF 55 to 75%, no aortic stenosis.  He underwent CABGx6 (LIMA-LAD, SVG-diagonal 1, sequential SVT to OM1 and OM2, sequential SVG to PDA and PL [RCA]) 04/08/2021. Postoperative course was notable for postoperative anemia with Hb on discharge 04/12/21 of 7.7.   He presents today for follow up. He and his wife are in the process of adopting his almost-six-year old autistic grandson. He had a root canal Wednesday and is healing well.  He has been walking at home for exercise. Reports no shortness of breath nor dyspnea on exertion. A few times per day he will notice a "short tension" in his chest which he attributes to incisional healing. Tells me this is more of a "twinge" but not really pain. Tells me in his heart he will occasionally feel a "twinge" which was not present prior to surgery. Describes as a pain level of 1-2 which he describes as a "pang" occurs both at rest and with activity. Tells me this is a lot different and less substantial than prior to surgery. No edema, orthopnea,  PND. Reports no palpitations. Still endorses some sore throat after breathing tube in place. Reports incisions are healing well. He is interested in participating in cardiac rehab. He has a dog who he enjoys walking.   EKGs/Labs/Other Studies Reviewed:   The following studies were reviewed today:  Echo 04/01/21 1. Left ventricular ejection fraction, by estimation, is 55 to 60%. The  left ventricle has normal function. The left ventricle has no regional  wall motion abnormalities. Left ventricular diastolic parameters are  consistent with Grade I diastolic   dysfunction (impaired relaxation).   2. Right ventricular systolic function is normal. The right ventricular  size is normal. There is normal pulmonary artery systolic pressure. The  estimated right ventricular systolic pressure is 23.1 mmHg.   3. The mitral valve is normal in structure. Mild mitral valve  regurgitation. No evidence of mitral stenosis.   4. The aortic valve has an indeterminant number of cusps. Aortic valve  regurgitation is not visualized. Mild aortic valve sclerosis is present,  with no evidence of aortic valve stenosis.   5. The inferior vena cava is normal in size with greater than 50%  respiratory variability, suggesting right atrial pressure of 3 mmHg.    VAS US Doppler and carotid duplex 04/01/21 Summary:  Right Carotid: The extracranial vessels were near-normal with only minimal  wall                thickening or plaque.   Left Carotid: The extracranial vessels were near-normal with only minimal  wall               thickening or plaque.   Bilateral Extremity: Doppler waveforms remain within normal limits with  compression bilaterally for the radial arteries. Doppler waveforms remain  within normal limits with compression bilaterally for the ulnar arteries.  LHC 03/30/21 Prox LAD lesion is 90% stenosed. 1st Diag lesion is 75% stenosed. Mid LAD lesion is 50% stenosed. 2nd Diag lesion is 75% stenosed. Dist LAD lesion is 50% stenosed. 2nd Mrg lesion is 80% stenosed. Prox Cx to Mid Cx lesion is 75% stenosed. Ost Cx lesion is 50% stenosed. RPDA lesion is 90% stenosed. The left ventricular systolic function is normal. LV end diastolic pressure is normal. The left ventricular ejection fraction is 55-65% by visual estimate. There is no aortic valve stenosis.   Cardiac surgery consult planned due to multivessel disease.     Patient not having any rest pain.  I stressed to him and his wife that he needs to avoid any exertion that brings on chest pressure.    EKG:  EKG is ordered today.  The ekg ordered today demonstrates NSR 67 bpm with no acute ST/T wave changes.   Recent Labs: 04/06/2021: ALT 16 04/09/2021: Magnesium 2.5 04/10/2021: BUN 15; Creatinine, Ser 0.97; Potassium 3.8; Sodium 135 04/12/2021: Hemoglobin 7.7; Platelets 153  Recent Lipid Panel    Component Value Date/Time   TRIG 63 04/09/2021 0411    Home Medications   Current Meds  Medication Sig   acetaminophen (TYLENOL) 325 MG tablet Take 2 tablets (650 mg total) by mouth every 6 (six) hours as needed for mild pain.   aspirin EC 81 MG tablet Take 1 tablet (81 mg total) by mouth daily. Swallow whole.   atorvastatin (LIPITOR) 80 MG tablet Take 1 tablet (80 mg total) by mouth daily.   ferrous fumarate-b12-vitamic C-folic acid (TRINSICON / FOLTRIN) capsule Take 1 capsule by mouth 2 (two) times daily after a meal.   metoprolol tartrate (  LOPRESSOR) 25 MG tablet Take 0.5 tablets (12.5 mg total) by mouth 2 (two) times daily.   nitroGLYCERIN (NITROSTAT) 0.4 MG SL tablet Place 1 tablet (0.4 mg total) under the tongue every 5 (five) minutes as needed for chest pain.     Review of Systems    All other systems reviewed and are otherwise negative except as noted above.  Physical Exam    VS:  BP 100/70 (BP Location: Left Arm, Patient Position: Sitting, Cuff Size: Normal)   Pulse 67   Ht 5\' 9"  (1.753 m)   Wt 164 lb (74.4 kg)   SpO2 96%   BMI 24.22 kg/m  , BMI Body mass index is 24.22 kg/m.  Wt Readings from Last 3 Encounters:  05/07/21 164 lb (74.4 kg)  04/13/21 168 lb 10.4 oz (76.5 kg)  04/06/21 171 lb 6 oz (77.7 kg)     GEN: Well nourished, well developed, in no acute distress. HEENT: normal. Neck: Supple, no JVD, carotid bruits, or masses. Cardiac: RRR, no murmurs, rubs, or gallops. No clubbing, cyanosis, edema.  Radials/PT 2+ and equal bilaterally.  Respiratory:  Respirations regular and unlabored, clear to auscultation bilaterally. GI: Soft, nontender, nondistended. MS:  No deformity or atrophy. Skin: Warm and dry, no rash. Midsternal incision healing appropriately with no signs of infection. Neuro:  Strength and sensation are intact. Psych: Normal affect.  Assessment & Plan    CAD s/p CABGx6 04/08/21 - Recovering well from surgery. Upcoming follow up with cardiothoracic surgery 05/19/21. Interested in cardiac rehab and encouraged to participate, may begin cardiac rehab. He plans to start cardiac rehab after he is provided clearance to drive. GDMT includes aspirin, metoprolol, atorvastatin, PRN nitroglycerin. Refill provided. Heart healthy diet and regular cardiovascular exercise encouraged. CMP today.   HLD, LDL goal  <70 - Discharged on Atorvastatin 80mg  daily. Direct LDL today.  Post operative anemia - Hb on discharge of 7.7. On iron supplement per cardiothoracic surgery. CBC today for monitoring.   Disposition: Follow up in 3 month(s) with Dr. 05/21/21 or APP.   Signed, , NP 05/07/2021, 2:19 PM [facility]

## 2021-05-07 NOTE — Patient Instructions (Addendum)
Medication Instructions:  Continue your current medications.   *If you need a refill on your cardiac medications before your next appointment, please call your pharmacy*   Lab Work: Your physician recommends lab work today: CMP, CBC, direct LDL  If you have labs (blood work) drawn today and your tests are completely normal, you will receive your results only by: MyChart Message (if you have MyChart) OR A paper copy in the mail If you have any lab test that is abnormal or we need to change your treatment, we will call you to review the results.  Testing/Procedures: None ordered today.   Follow-Up: At [facility], you and your health needs are our priority.  As part of our continuing mission to provide you with exceptional heart care, we have created designated Provider Care Teams.  These Care Teams include your primary Cardiologist (physician) and Advanced Practice Providers (APPs -  Physician Assistants and Nurse Practitioners) who all work together to provide you with the care you need, when you need it.  We recommend signing up for the patient portal called "MyChart".  Sign up information is provided on this After Visit Summary.  MyChart is used to connect with patients for Virtual Visits (Telemedicine).  Patients are able to view lab/test results, encounter notes, upcoming appointments, etc.  Non-urgent messages can be sent to your provider as well.   To learn more about what you can do with MyChart, go to [email or web address].    Your next appointment:   3 month(s)  The format for your next appointment:   In Person  Provider:   You may see Lance Muss, MD or one of the following Advanced Practice Providers on your designated Care Team:   Ronie Spies, PA-C Jacolyn Reedy, PA-C   Other Instructions  You have been referred to cardiac rehab. This is a combination program including monitored exercise, dietary education, and support group. We strongly recommend  participating in the program. Expect a phone call from them in approximately 2 weeks. If you do not hear from them, the phone number for cardiac rehab at [facility] is [phone].    Heart Healthy Diet Recommendations: A low-salt diet is recommended. Meats should be grilled, baked, or boiled. Avoid fried foods. Focus on lean protein sources like fish or chicken with vegetables and fruits. The American Heart Association is a Chief Technology Officer!  American Heart Association Diet and Lifeystyle Recommendations   Exercise recommendations: The American Heart Association recommends 150 minutes of moderate intensity exercise weekly. Try 30 minutes of moderate intensity exercise 4-5 times per week. This could include walking, jogging, or swimming.  For coronary artery disease often called "heart disease" we aim for optimal guideline directed medical therapy. We use the "A, B, C"s to help keep [place] on track!  A = Aspirin 81mg  daily B = Blood pressure control. C = Cholesterol control. You take Atorvastatin to help control your cholesterol.  D = Don't forget nitroglycerin! This is an emergency tablet to be used if you have chest pain.

## 2021-05-08 LAB — CBC
Hematocrit: 37.3 % — ABNORMAL LOW (ref 37.5–51.0)
Hemoglobin: 12.8 g/dL — ABNORMAL LOW (ref 13.0–17.7)
MCH: 32.2 pg (ref 26.6–33.0)
MCHC: 34.3 g/dL (ref 31.5–35.7)
MCV: 94 fL (ref 79–97)
Platelets: 293 10*3/uL (ref 150–450)
RBC: 3.98 x10E6/uL — ABNORMAL LOW (ref 4.14–5.80)
RDW: 12.1 % (ref 11.6–15.4)
WBC: 7.2 10*3/uL (ref 3.4–10.8)

## 2021-05-08 LAB — COMPREHENSIVE METABOLIC PANEL
ALT: 22 IU/L (ref 0–44)
AST: 13 IU/L (ref 0–40)
Albumin/Globulin Ratio: 1.7 (ref 1.2–2.2)
Albumin: 4.3 g/dL (ref 3.7–4.7)
Alkaline Phosphatase: 107 IU/L (ref 44–121)
BUN/Creatinine Ratio: 16 (ref 10–24)
BUN: 15 mg/dL (ref 8–27)
Bilirubin Total: 0.4 mg/dL (ref 0.0–1.2)
CO2: 23 mmol/L (ref 20–29)
Calcium: 9.4 mg/dL (ref 8.6–10.2)
Chloride: 102 mmol/L (ref 96–106)
Creatinine, Ser: 0.94 mg/dL (ref 0.76–1.27)
Globulin, Total: 2.6 g/dL (ref 1.5–4.5)
Glucose: 128 mg/dL — ABNORMAL HIGH (ref 65–99)
Potassium: 4.1 mmol/L (ref 3.5–5.2)
Sodium: 143 mmol/L (ref 134–144)
Total Protein: 6.9 g/dL (ref 6.0–8.5)
eGFR: 86 mL/min/{1.73_m2} (ref >59)

## 2021-05-08 LAB — LDL CHOLESTEROL, DIRECT: LDL Direct: 56 mg/dL (ref 0–99)

## 2021-05-18 ENCOUNTER — Other Ambulatory Visit: Payer: Self-pay | Admitting: Surgery

## 2021-05-18 DIAGNOSIS — Z951 Presence of aortocoronary bypass graft: Secondary | ICD-10-CM

## 2021-05-19 ENCOUNTER — Other Ambulatory Visit: Payer: Self-pay

## 2021-05-19 ENCOUNTER — Ambulatory Visit (INDEPENDENT_AMBULATORY_CARE_PROVIDER_SITE_OTHER): Payer: Self-pay | Admitting: Surgery

## 2021-05-19 ENCOUNTER — Encounter: Payer: Self-pay | Admitting: Surgery

## 2021-05-19 ENCOUNTER — Ambulatory Visit
Admission: RE | Admit: 2021-05-19 | Discharge: 2021-05-19 | Disposition: A | Discharge status: Home / Self Care | Payer: Federal, State, Local not specified - PPO | Source: Ambulatory Visit | Attending: Surgery | Admitting: Surgery

## 2021-05-19 VITALS — BP 130/76 | HR 60 | Resp 20 | Ht 69.0 in | Wt 164.0 lb

## 2021-05-19 DIAGNOSIS — Z951 Presence of aortocoronary bypass graft: Secondary | ICD-10-CM | POA: Diagnosis not present

## 2021-05-19 DIAGNOSIS — J9811 Atelectasis: Secondary | ICD-10-CM | POA: Diagnosis not present

## 2021-05-19 DIAGNOSIS — I2510 Atherosclerotic heart disease of native coronary artery without angina pectoris: Secondary | ICD-10-CM

## 2021-05-19 DIAGNOSIS — M47814 Spondylosis without myelopathy or radiculopathy, thoracic region: Secondary | ICD-10-CM | POA: Diagnosis not present

## 2021-05-19 NOTE — Progress Notes (Signed)
    HPI: Patient returns for routine postoperative follow-up having undergone coronary artery bypass graft surgery x6 on 04/08/2021. The patient's early postoperative recovery while in the hospital was notable for an uncomplicated postoperative course. Since hospital discharge the patient reports that he has been feeling well.  He is walking daily without chest pain or shortness of breath.  He is planning on participating in cardiac rehab.   Current Outpatient Medications  Medication Sig Dispense Refill   acetaminophen (TYLENOL) 325 MG tablet Take 2 tablets (650 mg total) by mouth every 6 (six) hours as needed for mild pain.     aspirin EC 81 MG tablet Take 1 tablet (81 mg total) by mouth daily. Swallow whole. 90 tablet 3   atorvastatin (LIPITOR) 80 MG tablet Take 1 tablet (80 mg total) by mouth daily. 90 tablet 1   ferrous fumarate-b12-vitamic C-folic acid (TRINSICON / FOLTRIN) capsule Take 1 capsule by mouth 2 (two) times daily after a meal. (Patient taking differently: Take 1 capsule by mouth at bedtime.) 60 capsule 1   metoprolol tartrate (LOPRESSOR) 25 MG tablet Take 0.5 tablets (12.5 mg total) by mouth 2 (two) times daily. 90 tablet 1   nitroGLYCERIN (NITROSTAT) 0.4 MG SL tablet Place 1 tablet (0.4 mg total) under the tongue every 5 (five) minutes as needed for chest pain. 25 tablet 6   No current facility-administered medications for this visit.    Physical Exam: BP 130/76   Pulse 60   Resp 20   Ht 5\' 9"  (1.753 m)   Wt 164 lb (74.4 kg)   SpO2 99% Comment: RA  BMI 24.22 kg/m  He looks well. Cardiac exam shows regular rate and rhythm with normal heart sounds. Lungs are clear. The chest incision is healing well and the sternum is stable. His leg incision is healing well and there is no peripheral edema.  Diagnostic Tests:  Narrative & Impression  CLINICAL DATA:  CABG.   EXAM: CHEST - 2 VIEW   COMPARISON:  04/11/2021.   FINDINGS: Prior CABG. Heart size normal. Interim  near complete resolution of left base atelectasis. No pleural effusion. No pneumothorax. No acute bony abnormality. Degenerative change thoracic spine.   IMPRESSION: 1.  Interim near complete resolution of left base atelectasis.   2.  Prior CABG.  Heart size normal.     Electronically Signed   By: 04/13/2021  Register   On: 05/19/2021 12:31      Impression:  He is recovering very well following his surgery.  I told him he can return to driving a car at this time but should refrain lifting anything heavier than 10 pounds for 3 months postoperatively.  Plan: He will continue to follow-up with his PCP and cardiology and will return to see me if he has any problems with his incisions   05/21/2021, MD [facility] [phone]

## 2021-06-04 ENCOUNTER — Other Ambulatory Visit: Payer: Self-pay | Admitting: Physician Assistant

## 2021-06-17 ENCOUNTER — Telehealth: Payer: Self-pay | Admitting: *Deleted

## 2021-06-17 ENCOUNTER — Telehealth (HOSPITAL_COMMUNITY): Payer: Self-pay

## 2021-06-17 DIAGNOSIS — Z006 Encounter for examination for normal comparison and control in clinical research program: Secondary | ICD-10-CM

## 2021-06-17 NOTE — Telephone Encounter (Signed)
Pt is not interested in the cardiac rehab at this time. ?Closed referral. ?

## 2021-06-17 NOTE — Telephone Encounter (Signed)
I called patient for 90 day Identify Study phone call. I left message for patient to call me back or respond to my e-mail. 

## 2021-08-25 ENCOUNTER — Ambulatory Visit: Payer: Federal, State, Local not specified - PPO | Admitting: Interventional Cardiology

## 2021-08-31 DIAGNOSIS — Z13220 Encounter for screening for lipoid disorders: Secondary | ICD-10-CM | POA: Diagnosis not present

## 2021-08-31 DIAGNOSIS — Z0000 Encounter for general adult medical examination without abnormal findings: Secondary | ICD-10-CM | POA: Diagnosis not present

## 2021-08-31 DIAGNOSIS — Z23 Encounter for immunization: Secondary | ICD-10-CM | POA: Diagnosis not present

## 2021-11-01 ENCOUNTER — Other Ambulatory Visit: Payer: Self-pay | Admitting: Family

## 2021-11-01 DIAGNOSIS — I25118 Atherosclerotic heart disease of native coronary artery with other forms of angina pectoris: Secondary | ICD-10-CM

## 2021-11-01 DIAGNOSIS — Z951 Presence of aortocoronary bypass graft: Secondary | ICD-10-CM

## 2021-11-30 NOTE — Progress Notes (Signed)
Cardiology Office Note   Date:  12/01/2021   ID:  Andres Green, Andres Green Andres Green, MRN [id number]  PCP:  Andres Harvest, PA-C    No chief complaint on file.  CAD  Wt Readings from Last 3 Encounters:  12/01/21 171 lb (77.6 kg)  05/19/21 164 lb (74.4 kg)  05/07/21 164 lb (74.4 kg)       History of Present Illness: Andres Green is a 76 y.o. male  with a hx of CAD s/p CABGx6 03/2021.  Prior history showed: "Gated cardiac CTA 03/23/2019 calcium score of 392 placing the 62nd percentile for age and sex will significant disease in the LAD and further FFR analysis showing significant disease in mid and distal LAD.  Cardiac catheterization 03/30/2021 with three-vessel coronary disease 90% proximal LAD, 50% mid and distal LAD, first diagonal 75% ostial, left circumflex 75% proximal to mid stenosis with 80% stenosis at second marginal branch, RCA diffusely irregular with 90% stenosis the ostium of the PDA.  LVEF 55 to 75%, no aortic stenosis.  He underwent CABGx6 (LIMA-LAD, SVG-diagonal 1, sequential SVT to OM1 and OM2, sequential SVG to PDA and PL [RCA]) 04/08/2021. " VAS US Doppler and carotid duplex 04/01/21 Summary:  Right Carotid: The extracranial vessels were near-normal with only minimal  wall                thickening or plaque.   Left Carotid: The extracranial vessels were near-normal with only minimal  wall               thickening or plaque.   Since heart surgery, he has done well.  He goes for two walks a day.  He has lost weight and waist size has decreased.  Eating more oatmeal.  Decreased egg intake.  Stress from caring for his autistic 53 year old grandson.    Denies : Exertional chest pain. Dizziness. Leg edema. Nitroglycerin use. Orthopnea. Palpitations. Paroxysmal nocturnal dyspnea. Shortness of breath. Syncope.   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  He has 16 chickens at home.  No problems with this activity.       Past Medical History:  Diagnosis Date   Anginal  pain ([place])    Chest pain    Coronary artery disease    s/p CABG x6 04/08/21   Fatigue    Hyperlipidemia LDL goal <70    Left shoulder pain     Past Surgical History:  Procedure Laterality Date   CARDIAC CATHETERIZATION     03/30/21   CORONARY ARTERY BYPASS GRAFT N/A 04/08/2021   Procedure: CORONARY ARTERY BYPASS GRAFTING (CABG)x6. LEFT INTERNAL MAMMARY ARTERY. RIGHT ENDOSCOPIC SAPHENOUS VEIN HARVESTING.;  Surgeon: Gaye Pollack, MD;  Location: [facility] OR;  Service: Open Heart Surgery;  Laterality: N/A;   EYE SURGERY Bilateral    CATARACT SX, WITH LENS PLACEMENT   LEFT HEART CATH AND CORONARY ANGIOGRAPHY N/A 03/30/2021   Procedure: LEFT HEART CATH AND CORONARY ANGIOGRAPHY;  Surgeon: Jettie Booze, MD;  Location: [place] CV LAB;  Service: Cardiovascular;  Laterality: N/A;   TEE WITHOUT CARDIOVERSION N/A 04/08/2021   Procedure: TRANSESOPHAGEAL ECHOCARDIOGRAM (TEE);  Surgeon: Gaye Pollack, MD;  Location: [place];  Service: Open Heart Surgery;  Laterality: N/A;   WIDSOM TOOTH REMOVAL       Current Outpatient Medications  Medication Sig Dispense Refill   acetaminophen (TYLENOL) 325 MG tablet Take 2 tablets (650 mg total) by mouth every 6 (six) hours as needed for  mild pain.     aspirin EC 81 MG tablet Take 1 tablet (81 mg total) by mouth daily. Swallow whole. 90 tablet 3   atorvastatin (LIPITOR) 80 MG tablet TAKE 1 TABLET BY MOUTH EVERY DAY 90 tablet 3   metoprolol tartrate (LOPRESSOR) 25 MG tablet Take 0.5 tablets (12.5 mg total) by mouth 2 (two) times daily. 90 tablet 1   nitroGLYCERIN (NITROSTAT) 0.4 MG SL tablet Place 1 tablet (0.4 mg total) under the tongue every 5 (five) minutes as needed for chest pain. 25 tablet 6   No current facility-administered medications for this visit.    Allergies:   Patient has no known allergies.    Social History:  The patient  reports that he has never smoked. He has never used smokeless tobacco. He reports current alcohol use. He reports  current drug use. Drug: Marijuana.   Family History:  The patient's family history includes Cancer - Prostate in his father; Diabetes in his mother; Heart disease in his father; Stroke in his mother.    ROS:  Please see the history of present illness.   Otherwise, review of systems are positive for intentional weight loss.   All other systems are reviewed and negative.    PHYSICAL EXAM: VS:  BP 102/70    Pulse 70    Ht 5\' 9"  (1.753 m)    Wt 171 lb (77.6 kg)    SpO2 98%    BMI 25.25 kg/m  , BMI Body mass index is 25.25 kg/m. GEN: Well nourished, well developed, in no acute distress HEENT: normal Neck: no JVD, carotid bruits, or masses Cardiac: RRR; no murmurs, rubs, or gallops,no edema  Respiratory:  clear to auscultation bilaterally, normal work of breathing GI: soft, nontender, nondistended, + BS MS: no deformity or atrophy Skin: warm and dry, no rash Neuro:  Strength and sensation are intact Psych: euthymic mood, full affect   EKG:   The ekg ordered today demonstrates NSR, nonspecific ST changes   Recent Labs: 04/09/2021: Magnesium 2.5 05/07/2021: ALT 22; BUN 15; Creatinine, Ser 0.94; Hemoglobin 12.8; Platelets 293; Potassium 4.1; Sodium 143   Lipid Panel    Component Value Date/Time   TRIG 63 04/09/2021 0411   LDLDIRECT 56 05/07/2021 1459     Other studies Reviewed: Additional studies/ records that were reviewed today with results demonstrating: labs reviewed.   ASSESSMENT AND PLAN:  CAD: s/p CABG 03/2021. No angina on medical therapy. Has some atypical chest pain, not related to exertion, likely related to chest wall changes after surgery.  Continue to monitor.  If he has any exertional symptoms, he will let us know.  He is very satisfied with his ability to be active. Hyperlipidemia: LDL 56.  High dose atorvastatin.  Whole food, plant based diet recommended.  Minimize animal products.  Low sodium diet.  Avoid processed foods.  He has done very well losing weight.  We  spoke about moderation in terms of egg yolks and red meat. FAmily h/o CAD: No change since last visit. He got his COVID booster in November 2022 prior to flying to [place] in December 2022.  He is up-to-date on his flu shot.    Current medicines are reviewed at length with the patient today.  The patient concerns regarding his medicines were addressed.  The following changes have been made:  No change  Labs/ tests ordered today include:  No orders of the defined types were placed in this encounter.   Recommend 150 minutes/week of aerobic  exercise Low fat, low carb, high fiber diet recommended  Disposition:   FU in 1 year   Signed, Larae Grooms, MD  12/01/2021 10:01 AM    [facility] [place], [place], [place]  [address] Phone: [phone]; Fax: [phone]

## 2021-12-01 ENCOUNTER — Ambulatory Visit: Payer: Federal, State, Local not specified - PPO | Admitting: Interventional Cardiology

## 2021-12-01 ENCOUNTER — Other Ambulatory Visit: Payer: Self-pay

## 2021-12-01 ENCOUNTER — Encounter: Payer: Self-pay | Admitting: Interventional Cardiology

## 2021-12-01 VITALS — BP 102/70 | HR 70 | Ht 69.0 in | Wt 171.0 lb

## 2021-12-01 DIAGNOSIS — E785 Hyperlipidemia, unspecified: Secondary | ICD-10-CM | POA: Diagnosis not present

## 2021-12-01 DIAGNOSIS — Z8249 Family history of ischemic heart disease and other diseases of the circulatory system: Secondary | ICD-10-CM | POA: Diagnosis not present

## 2021-12-01 DIAGNOSIS — Z951 Presence of aortocoronary bypass graft: Secondary | ICD-10-CM | POA: Diagnosis not present

## 2021-12-01 DIAGNOSIS — I25118 Atherosclerotic heart disease of native coronary artery with other forms of angina pectoris: Secondary | ICD-10-CM | POA: Diagnosis not present

## 2021-12-01 NOTE — Patient Instructions (Signed)
Medication Instructions:  °Your physician recommends that you continue on your current medications as directed. Please refer to the Current Medication list given to you today. ° °*If you need a refill on your cardiac medications before your next appointment, please call your pharmacy* ° ° °Lab Work: °None ° °If you have labs (blood work) drawn today and your tests are completely normal, you will receive your results only by: °MyChart Message (if you have MyChart) OR °A paper copy in the mail °If you have any lab test that is abnormal or we need to change your treatment, we will call you to review the results. ° ° °Testing/Procedures: °None ° °Follow-Up: °At [facility], you and your health needs are our priority.  As part of our continuing mission to provide you with exceptional heart care, we have created designated Provider Care Teams.  These Care Teams include your primary Cardiologist (physician) and Advanced Practice Providers (APPs -  Physician Assistants and Nurse Practitioners) who all work together to provide you with the care you need, when you need it. ° °We recommend signing up for the patient portal called "MyChart".  Sign up information is provided on this After Visit Summary.  MyChart is used to connect with patients for Virtual Visits (Telemedicine).  Patients are able to view lab/test results, encounter notes, upcoming appointments, etc.  Non-urgent messages can be sent to your provider as well.   °To learn more about what you can do with MyChart, go to [email or web address].   ° °Your next appointment:   °12 month(s) ° °The format for your next appointment:   °In Person ° °Provider:   °Jayadeep Varanasi, MD   ° ° °Other Instructions °None   °

## 2021-12-21 ENCOUNTER — Other Ambulatory Visit: Payer: Self-pay | Admitting: Interventional Cardiology

## 2021-12-24 ENCOUNTER — Ambulatory Visit: Payer: Federal, State, Local not specified - PPO | Admitting: Interventional Cardiology

## 2022-01-27 ENCOUNTER — Other Ambulatory Visit: Payer: Self-pay | Admitting: Interventional Cardiology

## 2022-02-02 ENCOUNTER — Other Ambulatory Visit: Payer: Self-pay | Admitting: Interventional Cardiology

## 2022-02-03 ENCOUNTER — Other Ambulatory Visit: Payer: Self-pay

## 2022-02-03 MED ORDER — METOPROLOL TARTRATE 25 MG PO TABS: 12.5000 mg | ORAL_TABLET | Freq: Two times a day (BID) | ORAL | 3 refills | Status: DC

## 2022-02-03 NOTE — Telephone Encounter (Signed)
Pt's medication was sent to pt's pharmacy as requested. Confirmation received.  °

## 2022-04-22 IMAGING — DX DG CHEST 1V PORT
1 series · 1 of 1 positions shown · non-contrast
Comparison: 04/06/2021.

CLINICAL DATA: Line placement.

EXAM:
PORTABLE CHEST 1 VIEW

[chest ap]
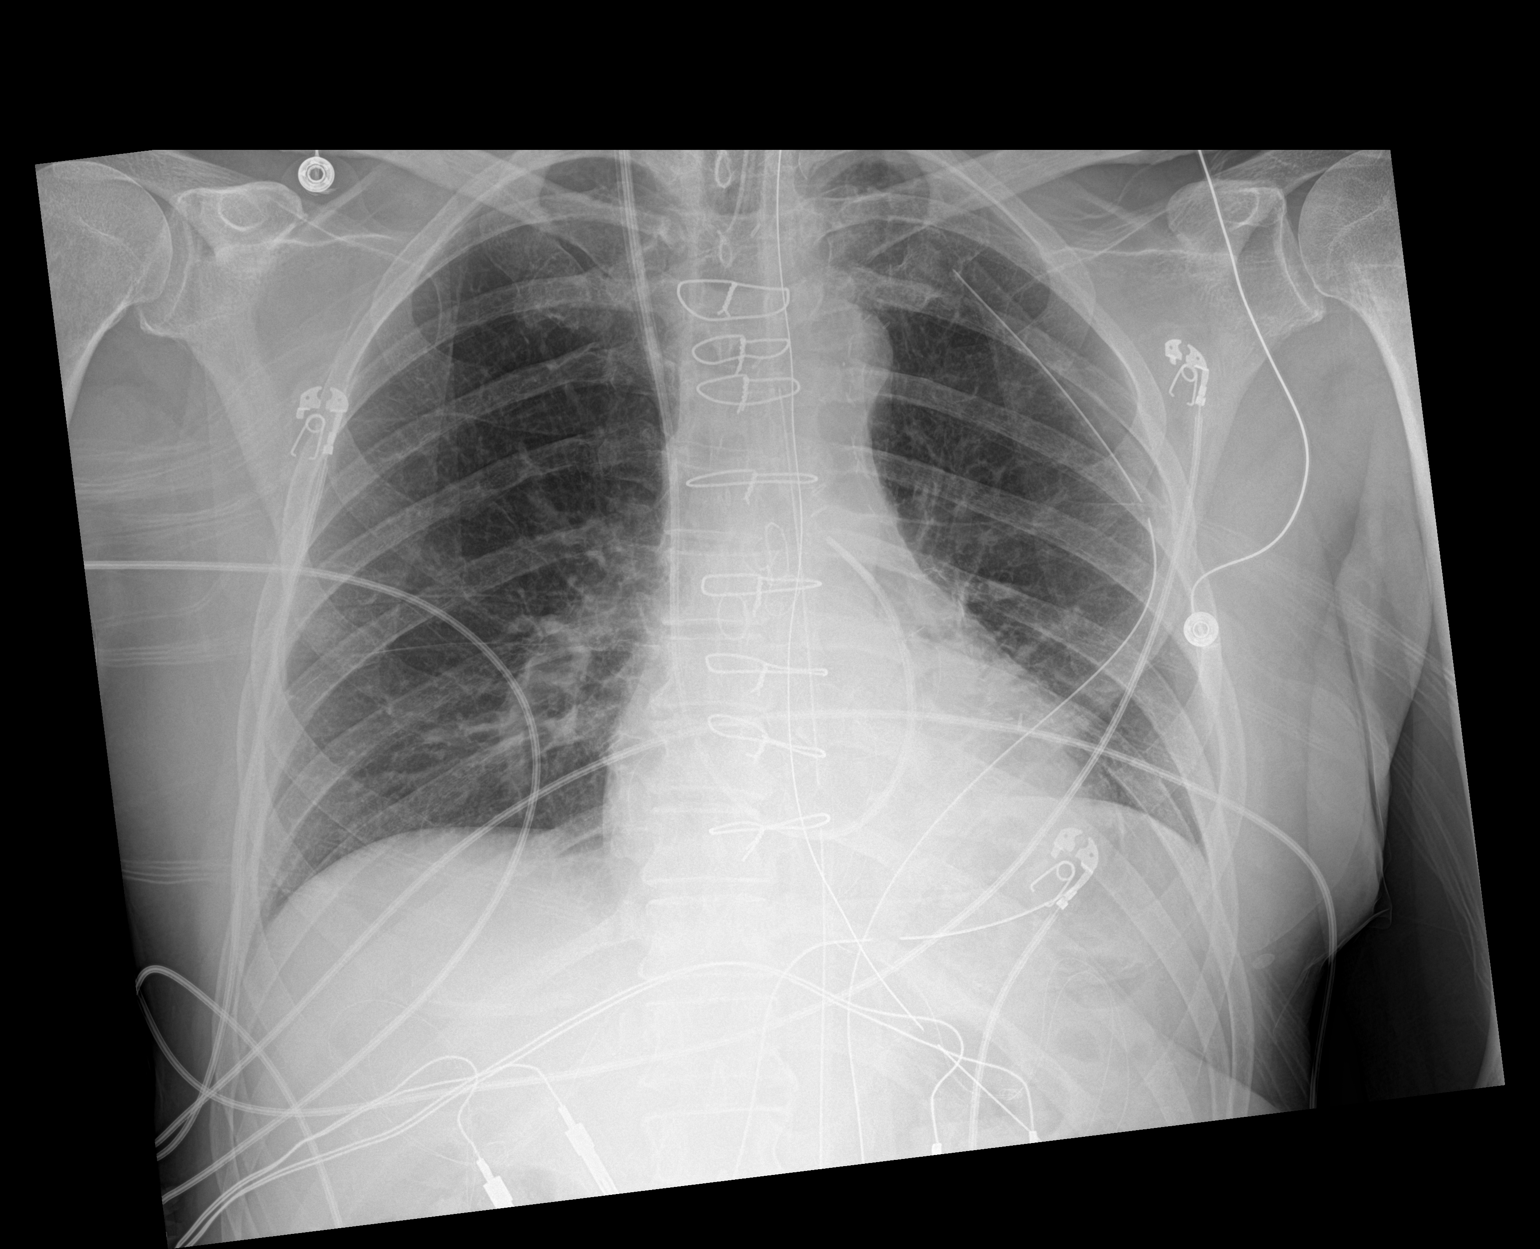

[1 of 1 positions shown; findings below may reference images not displayed]

FINDINGS: Endotracheal tube noted with tip 8 cm above the carina at the level
of the thoracic inlet. Advancement of approximately 2 cm should be
considered. NG tube noted with tip below left hemidiaphragm.
Swan-Ganz catheter noted with tip in pulmonary outflow tract. Left
chest tube noted with tip over the left upper chest. Mediastinal
drainage catheter noted with tip over left lower chest. Prior CABG.
Heart size normal. Low lung volumes with mild bibasilar atelectasis.
No pleural effusion or pneumothorax.
IMPRESSION: 1. Endotracheal tube noted with its tip 8 cm above the carina at the
level of the thoracic inlet. Advancement approximately 2 cm should
be considered. Remaining lines and tubes including left chest tube
in good anatomic position. No pneumothorax.

2.  Prior CABG.  Heart size normal.

3.  Low lung volumes with mild bibasilar atelectasis.

## 2022-04-23 IMAGING — DX DG CHEST 1V PORT
1 series · 1 of 1 positions shown · non-contrast
Comparison: April 08, 2021

CLINICAL DATA: Status post extubation

EXAM:
PORTABLE CHEST 1 VIEW

[chest ap]
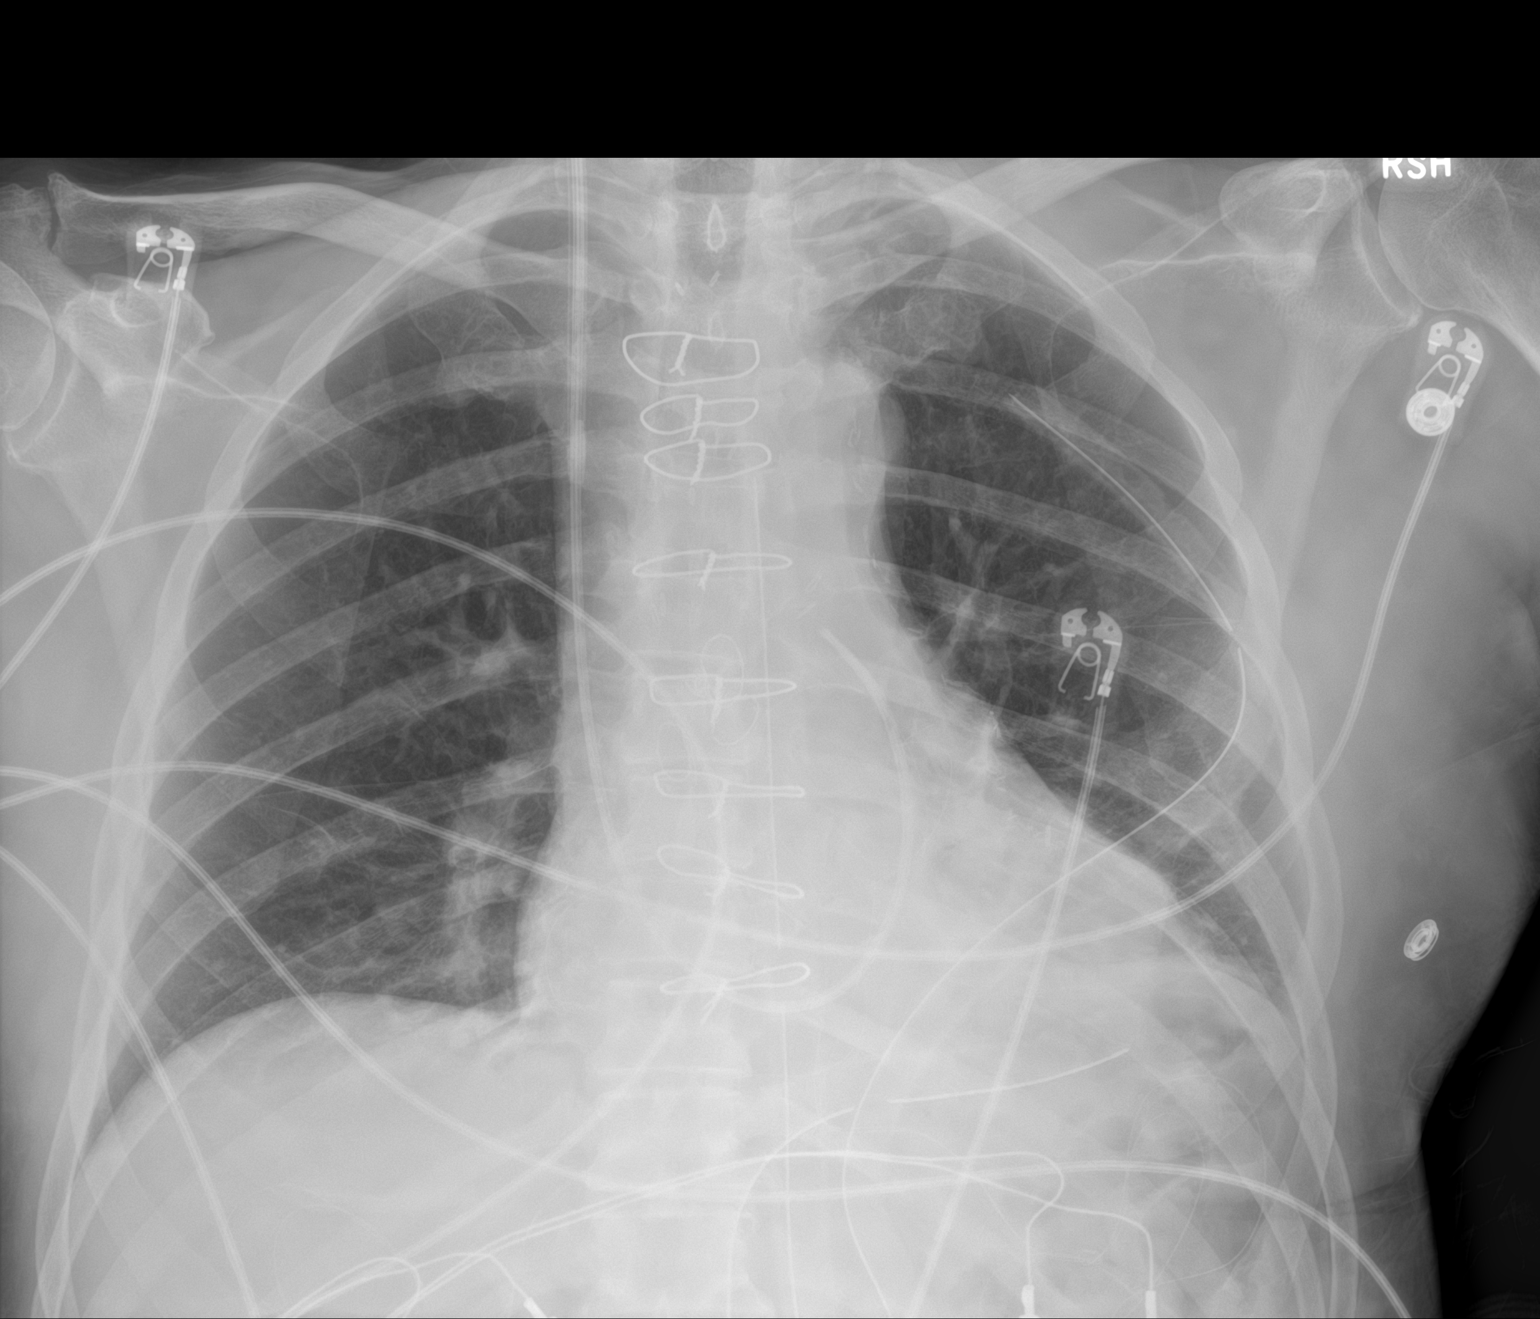

[1 of 1 positions shown; findings below may reference images not displayed]

FINDINGS: Endotracheal tube and nasogastric tube have been removed. Swan-Ganz
catheter tip is in the main pulmonary outflow tract. Left chest tube
and mediastinal drains remain in place, unchanged. No pneumothorax.
There is mild left base atelectasis. Lungs elsewhere are clear.
Heart is upper normal in size with pulmonary vascular normal. Status
post coronary artery bypass grafting. No bone lesions.
IMPRESSION: Tube and catheter positions as described without pneumothorax. Left
base atelectasis. Lungs otherwise clear. Stable cardiac silhouette.

## 2022-04-24 IMAGING — DX DG CHEST 1V PORT
1 series · 1 of 1 positions shown · non-contrast
Comparison: Chest x-ray 04/09/2021, CT heart 03/22/2021

CLINICAL DATA: History of CABG.

EXAM:
PORTABLE CHEST 1 VIEW

[chest]
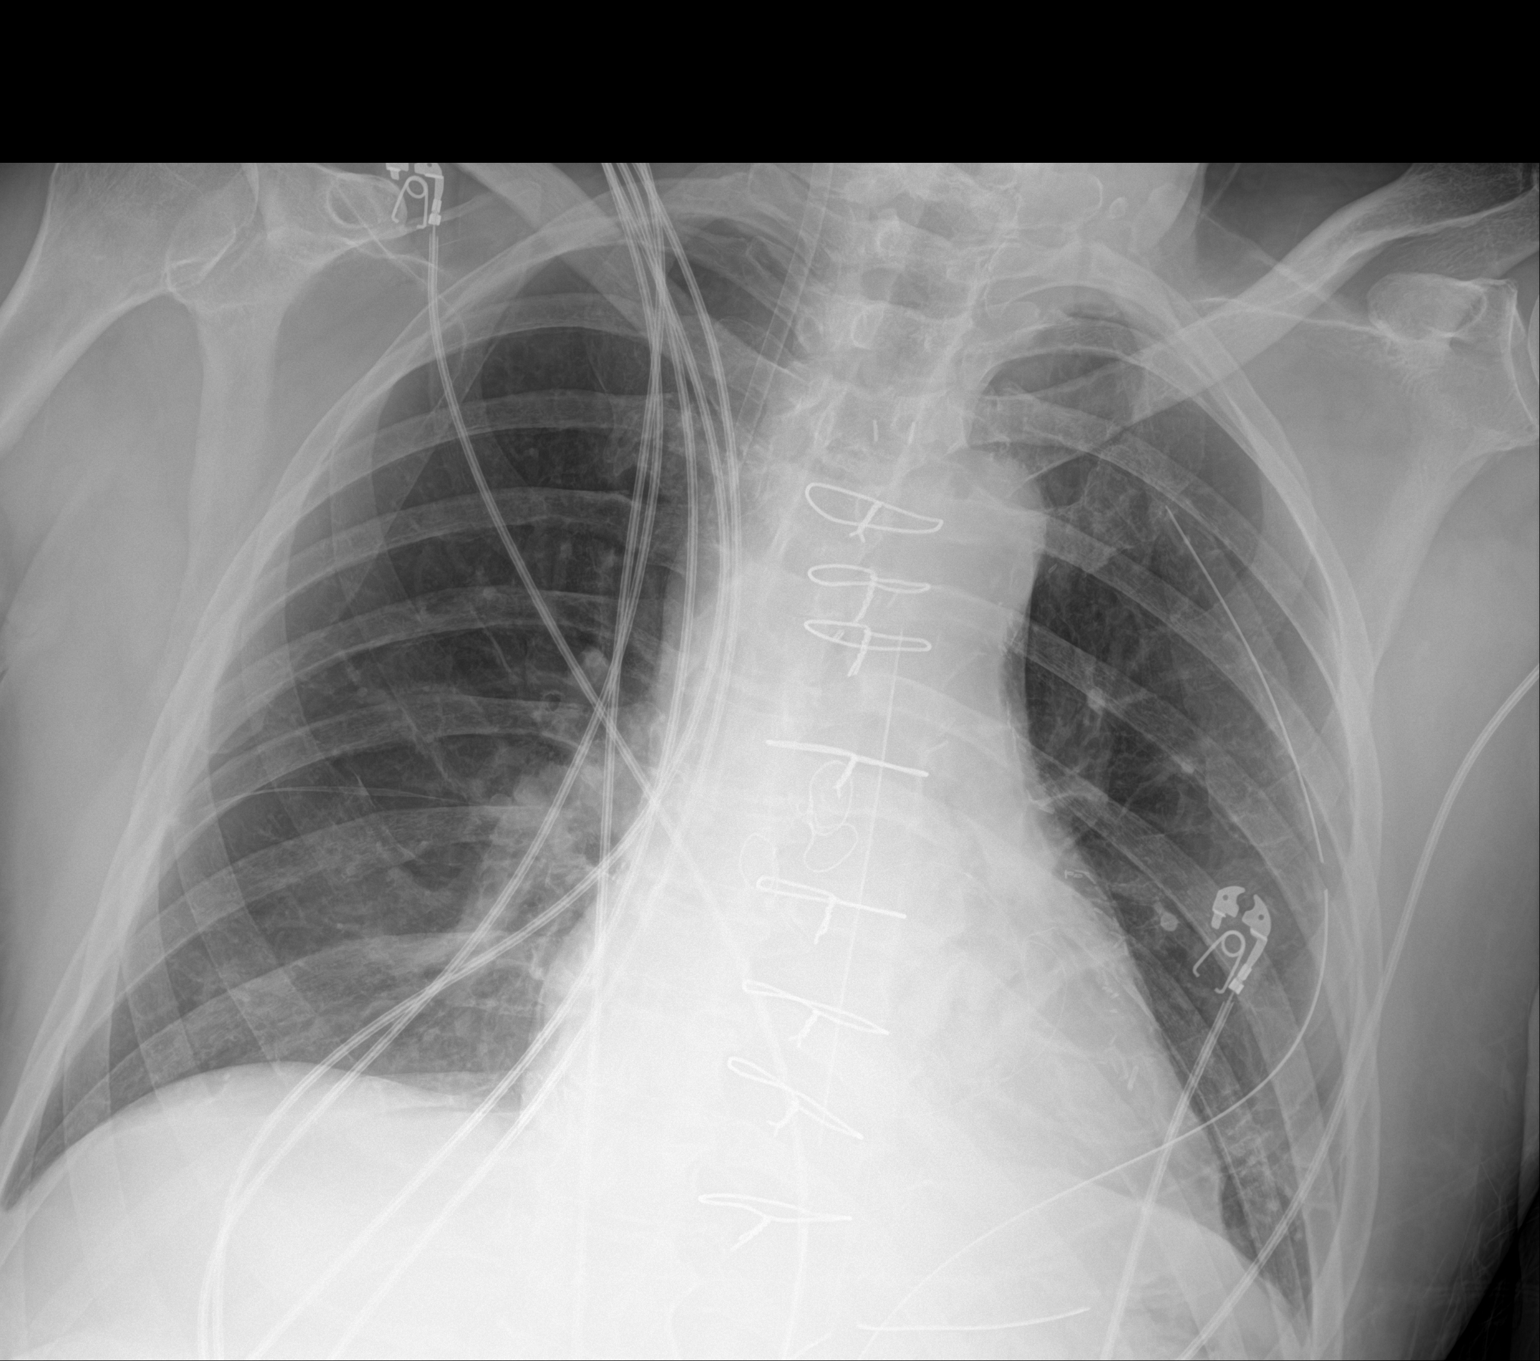

[1 of 1 positions shown; findings below may reference images not displayed]

FINDINGS: Left chest tube in stable position. Mediastinal drain in stable
position. Retained epicardial pacing wires noted. Interval removal
of a right internal jugular central venous approach Swan-Ganz
catheter. Right internal jugular central venous Cordis again noted
with tip overlying the expected region of the superior vena cava.

The heart size and mediastinal contours are within normal limits.

Improved aeration of the left base. Biapical pleural/pulmonary
scarring. No focal consolidation. No pulmonary edema. No pleural
effusion. No pneumothorax.

No acute osseous abnormality.
IMPRESSION: 1. No acute cardiopulmonary abnormality.
2. Interval removal of a Swan-Ganz catheter. Otherwise lines and
tubes are in stable position.

## 2022-04-25 IMAGING — CR DG CHEST 2V
2 series · 2 of 2 positions shown · non-contrast
Comparison: April 10, 2021

CLINICAL DATA: History of CABG.

EXAM:
CHEST - 2 VIEW

[chest pa]
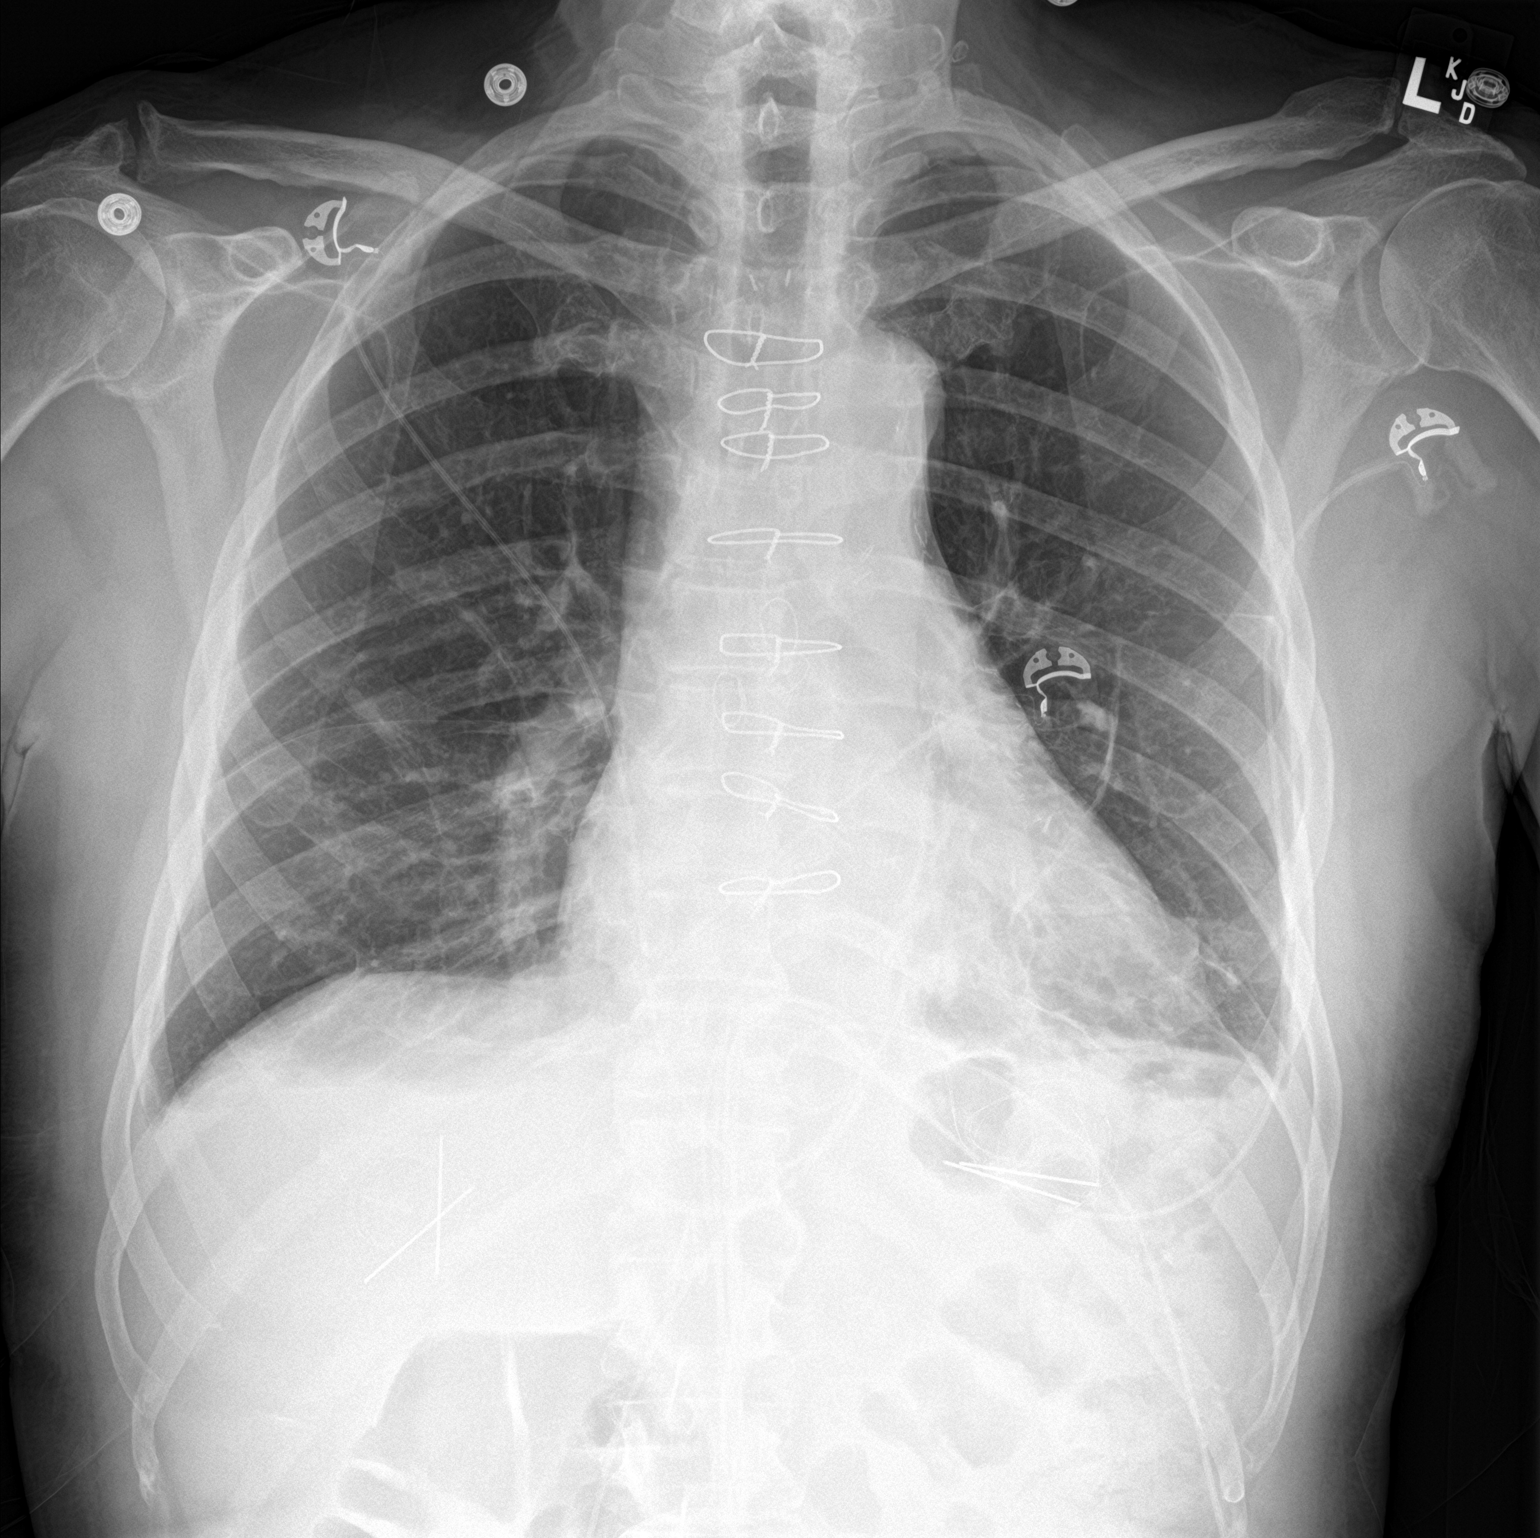

[chest lat]
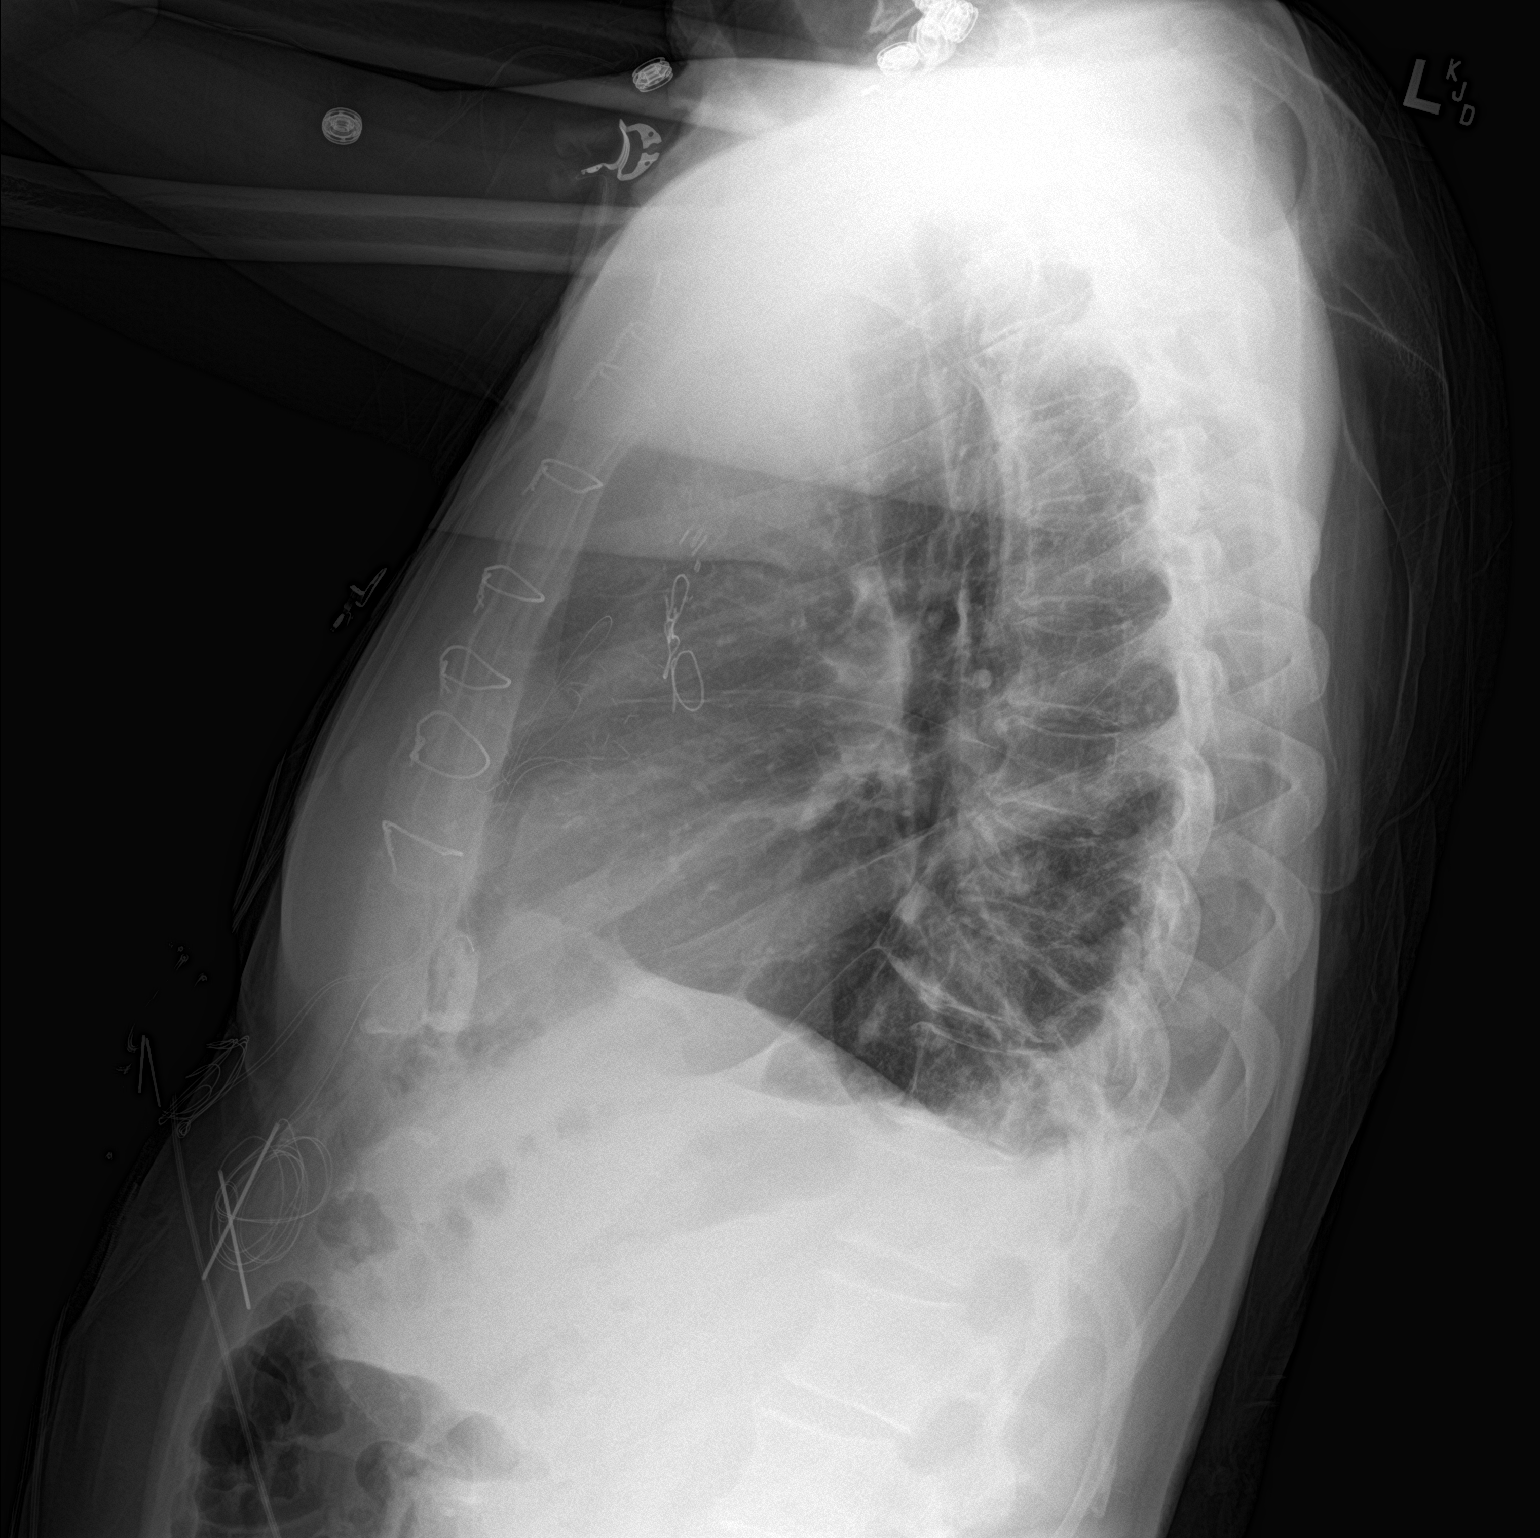

[2 of 2 positions shown; findings below may reference images not displayed]

FINDINGS: Support apparatus is been removed. No pneumothorax. The heart, hila,
mediastinum, and pleura are unchanged. Tiny pleural effusions. Mild
atelectasis in the left base. No other acute abnormalities.
IMPRESSION: Tiny pleural effusions. Mild atelectasis in the left base. No other
acute abnormalities.

## 2022-06-16 DIAGNOSIS — H0011 Chalazion right upper eyelid: Secondary | ICD-10-CM | POA: Diagnosis not present

## 2022-09-02 DIAGNOSIS — Z0000 Encounter for general adult medical examination without abnormal findings: Secondary | ICD-10-CM | POA: Diagnosis not present

## 2022-09-02 DIAGNOSIS — Z23 Encounter for immunization: Secondary | ICD-10-CM | POA: Diagnosis not present

## 2022-09-02 DIAGNOSIS — Z13220 Encounter for screening for lipoid disorders: Secondary | ICD-10-CM | POA: Diagnosis not present

## 2022-10-24 DIAGNOSIS — R300 Dysuria: Secondary | ICD-10-CM | POA: Diagnosis not present

## 2022-10-26 ENCOUNTER — Other Ambulatory Visit: Payer: Self-pay | Admitting: Family

## 2022-10-26 DIAGNOSIS — I25118 Atherosclerotic heart disease of native coronary artery with other forms of angina pectoris: Secondary | ICD-10-CM

## 2022-10-26 DIAGNOSIS — Z951 Presence of aortocoronary bypass graft: Secondary | ICD-10-CM

## 2022-10-26 NOTE — Telephone Encounter (Signed)
Patient of Dr. Varanasi. Please review for refill. Thank you!  

## 2022-11-18 ENCOUNTER — Other Ambulatory Visit: Payer: Self-pay | Admitting: Interventional Cardiology

## 2022-11-18 DIAGNOSIS — I25118 Atherosclerotic heart disease of native coronary artery with other forms of angina pectoris: Secondary | ICD-10-CM

## 2022-11-18 DIAGNOSIS — Z951 Presence of aortocoronary bypass graft: Secondary | ICD-10-CM

## 2022-12-05 ENCOUNTER — Other Ambulatory Visit: Payer: Self-pay | Admitting: Interventional Cardiology

## 2022-12-05 DIAGNOSIS — Z951 Presence of aortocoronary bypass graft: Secondary | ICD-10-CM

## 2022-12-05 DIAGNOSIS — I25118 Atherosclerotic heart disease of native coronary artery with other forms of angina pectoris: Secondary | ICD-10-CM

## 2023-01-21 ENCOUNTER — Other Ambulatory Visit: Payer: Self-pay | Admitting: Interventional Cardiology

## 2023-01-21 DIAGNOSIS — I25118 Atherosclerotic heart disease of native coronary artery with other forms of angina pectoris: Secondary | ICD-10-CM

## 2023-01-21 DIAGNOSIS — Z951 Presence of aortocoronary bypass graft: Secondary | ICD-10-CM

## 2023-02-16 ENCOUNTER — Other Ambulatory Visit: Payer: Self-pay | Admitting: Interventional Cardiology

## 2023-02-16 DIAGNOSIS — Z951 Presence of aortocoronary bypass graft: Secondary | ICD-10-CM

## 2023-02-16 DIAGNOSIS — I25118 Atherosclerotic heart disease of native coronary artery with other forms of angina pectoris: Secondary | ICD-10-CM

## 2023-03-15 ENCOUNTER — Telehealth: Payer: Self-pay | Admitting: Interventional Cardiology

## 2023-03-15 DIAGNOSIS — Z951 Presence of aortocoronary bypass graft: Secondary | ICD-10-CM

## 2023-03-15 DIAGNOSIS — I25118 Atherosclerotic heart disease of native coronary artery with other forms of angina pectoris: Secondary | ICD-10-CM

## 2023-03-15 MED ORDER — METOPROLOL TARTRATE 25 MG PO TABS: 25.0000 mg | ORAL_TABLET | Freq: Two times a day (BID) | ORAL | 0 refills | Status: DC

## 2023-03-15 NOTE — Telephone Encounter (Signed)
*  STAT* If patient is at the pharmacy, call can be transferred to refill team.   1. Which medications need to be refilled? (please list name of each medication and dose if known)   metoprolol tartrate (LOPRESSOR) 25 MG tablet   2. Which pharmacy/location (including street and city if local pharmacy) is medication to be sent to?  [id number] - [place], [place] - [address]   3. Do they need a 30 day or 90 day supply? 90 day  Patient stated he has 1 day left of this medication.  Patient has appointment scheduled on 5/2.

## 2023-03-15 NOTE — Addendum Note (Signed)
Addended by: Margaret Pyle D on: 03/15/2023 01:01 PM   Modules accepted: Orders

## 2023-03-15 NOTE — Telephone Encounter (Signed)
Pt's medication was sent to pt's pharmacy as requested. Confirmation received.  °

## 2023-03-23 ENCOUNTER — Ambulatory Visit: Payer: Federal, State, Local not specified - PPO | Admitting: Nurse Practitioner

## 2023-03-23 NOTE — Progress Notes (Unsigned)
Cardiology Office Note:    Date:  03/27/2023   ID:  Andres, Green [birth year], MRN [id number]  PCP:  Andres Green   [facility] Providers Cardiologist:  Andres Muss, MD     Referring MD: Andres Green, [place]   Chief Complaint: follow-up CAD  History of Present Illness:    BRAINARD Green is a very pleasant 77 y.o. 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  Initially evaluated 02/2021 due to chest pain by Dr. Eldridge Green after referral from his primary care provider. Gated cardiac CT 03/22/2021 revealed calcium score of 392, 62nd percentile for age and sex with significant disease in the LAD and further FFR analysis showed significant disease in the mid and distal LAD.  Cardiac catheterization 03/30/2021 with three-vessel coronary disease 90% proximal LAD, 50% mid and distal LAD, first diagonal 75% ostial, left circumflex 75% proximal to mid stenosis with 80% stenosis at second marginal branch, RCA diffusely irregular with 90% stenosis ostium of PDA.  LVEF 55 to 65%, no aortic stenosis.  He underwent CABG x 6 (LIMA-LAD, SVG-diagonal 1, sequential SVG to OM1 and OM2, sequential SVG to PDA and PL [RCA]) on 04/08/21.  Postoperative course was notable for anemia with hemoglobin on discharge of 7.7.  Seen in follow-up 05/07/21 by Andres Shields, NP and was doing well. He raises chickens at his home and cares for his autistic 42 year old grandson, was in the process of adopting him. Lab work at that time revealed improvement in blood counts with recommendation to reduce iron tablet to once daily, LDL 56.  Last cardiology clinic visit was 12/01/2021 with Dr. Eldridge Green at which time he had no specific cardiac concerns.  No changes were made to his treatment regimen and 1 year follow-up was recommended.  Today, he is here alone for follow-up. He and his wife have adopted his wife's autistic grandson since we last saw him which keeps him busy.  Admits to some anxiety due to meeting the child's needs. Tries to keep himself from getting overly stressed, avoids overexertion. His dog, who he previously walked 2 x per day, passed away so he admits to not be as active through the winter. Still raises chickens and is active doing yard and gardening work.  Considering getting another dog. Reports feeling a little less energetic, feels stressed by the current political unrest. Compares energy level to when he lived in [place] and hiked frequently. Up and down stairs at home frequently, occasionally feels some heart pounding (1/10 discomfort on pain scale). No additional symptoms with exertion. No chest pain, shortness of breath, edema, orthopnea, PND, presyncope, syncope. Eats a mostly plant based diet but drinks whole milk and likes ice cream. Of note, has been splitting metoprolol tartrate 50 mg tablets and taking 1/2 daily.   Past Medical History:  Diagnosis Date   Anginal pain (HCC)    Chest pain    Coronary artery disease    s/p CABG x6 04/08/21   Fatigue    Hyperlipidemia LDL goal <70    Left shoulder pain     Past Surgical History:  Procedure Laterality Date   CARDIAC CATHETERIZATION     03/30/21   CORONARY ARTERY BYPASS GRAFT N/A 04/08/2021   Procedure: CORONARY ARTERY BYPASS GRAFTING (CABG)x6. LEFT INTERNAL MAMMARY ARTERY. RIGHT ENDOSCOPIC SAPHENOUS VEIN HARVESTING.;  Surgeon: Alleen Borne, MD;  Location: [facility] OR;  Service: Open Heart Surgery;  Laterality:  N/A;   EYE SURGERY Bilateral    CATARACT SX, WITH LENS PLACEMENT   LEFT HEART CATH AND CORONARY ANGIOGRAPHY N/A 03/30/2021   Procedure: LEFT HEART CATH AND CORONARY ANGIOGRAPHY;  Surgeon: Corky Crafts, MD;  Location: [facility] INVASIVE CV LAB;  Service: Cardiovascular;  Laterality: N/A;   TEE WITHOUT CARDIOVERSION N/A 04/08/2021   Procedure: TRANSESOPHAGEAL ECHOCARDIOGRAM (TEE);  Surgeon: Alleen Borne, MD;  Location: [facility] OR;  Service: Open Heart Surgery;  Laterality: N/A;   WIDSOM  TOOTH REMOVAL      Current Medications: Current Meds  Medication Sig   aspirin EC 81 MG tablet Take 1 tablet (81 mg total) by mouth daily. Swallow whole.   atorvastatin (LIPITOR) 80 MG tablet Take 1 tablet (80 mg total) by mouth daily.   ezetimibe (ZETIA) 10 MG tablet Take 1 tablet (10 mg total) by mouth daily.   [DISCONTINUED] metoprolol tartrate (LOPRESSOR) 25 MG tablet Take 1 tablet (25 mg total) by mouth 2 (two) times daily.     Allergies:   Patient has no known allergies.   Social History   Socioeconomic History   Marital status: Married    Spouse name: Not on file   Number of children: Not on file   Years of education: Not on file   Highest education level: Not on file  Occupational History   Not on file  Tobacco Use   Smoking status: Never   Smokeless tobacco: Never  Vaping Use   Vaping Use: Never used  Substance and Sexual Activity   Alcohol use: Yes    Comment: 1 BEER/ 1 GLASS WINE X 5 WEEKLY   Drug use: Yes    Types: Marijuana    Comment: 40 YEARS AGO   Sexual activity: Not on file  Other Topics Concern   Not on file  Social History Narrative   Not on file   Social Determinants of Health   Financial Resource Strain: Not on file  Food Insecurity: Not on file  Transportation Needs: Not on file  Physical Activity: Not on file  Stress: Not on file  Social Connections: Not on file     Family History: The patient's family history includes Cancer - Prostate in his father; Diabetes in his mother; Heart disease in his father; Stroke in his mother.  ROS:   Please see the history of present illness.   + mild fatigue All other systems reviewed and are negative.  Labs/Other Studies Reviewed:    The following studies were reviewed today:   Echo 04/01/21 1. Left ventricular ejection fraction, by estimation, is 55 to 60%. The  left ventricle has normal function. The left ventricle has no regional  wall motion abnormalities. Left ventricular diastolic  parameters are  consistent with Grade I diastolic  dysfunction (impaired relaxation).   2. Right ventricular systolic function is normal. The right ventricular  size is normal. There is normal pulmonary artery systolic pressure. The  estimated right ventricular systolic pressure is 23.1 mmHg.   3. The mitral valve is normal in structure. Mild mitral valve  regurgitation. No evidence of mitral stenosis.   4. The aortic valve has an indeterminant number of cusps. Aortic valve  regurgitation is not visualized. Mild aortic valve sclerosis is present,  with no evidence of aortic valve stenosis.   5. The inferior vena cava is normal in size with greater than 50%  respiratory variability, suggesting right atrial pressure of 3 mmHg.  LHC 03/30/21 Prox LAD lesion is 90% stenosed. 1st  Diag lesion is 75% stenosed. Mid LAD lesion is 50% stenosed. 2nd Diag lesion is 75% stenosed. Dist LAD lesion is 50% stenosed. 2nd Mrg lesion is 80% stenosed. Prox Cx to Mid Cx lesion is 75% stenosed. Ost Cx lesion is 50% stenosed. RPDA lesion is 90% stenosed. The left ventricular systolic function is normal. LV end diastolic pressure is normal. The left ventricular ejection fraction is 55-65% by visual estimate. There is no aortic valve stenosis.   Cardiac surgery consult planned due to multivessel disease.      Coronary CTA 03/22/21 IMPRESSION: 1.  Calcium score 382 which is 62 nd percentile for age and sex   2. CAD RADS 4 disease in LAD study sent for FFR CT but low threshold to cath given multiple complex LAD lesions   3.  Normal aortic root diameter 3.3 cm  IMPRESSION: Abnormal FFR CT involving proximal mid and distal LAD and proximal D1   Dr Andres Green notified refer for cath Recent Labs: No results found for requested labs within last 365 days.  Recent Lipid Panel    Component Value Date/Time   TRIG 63 04/09/2021 0411   LDLDIRECT 56 05/07/2021 1459     Risk Assessment/Calculations:            Physical Exam:    VS:  BP 102/60   Pulse (!) 49   Ht 5\' 9"  (1.753 m)   Wt 173 lb 3.2 oz (78.6 kg)   SpO2 100%   BMI 25.58 kg/m     Wt Readings from Last 3 Encounters:  03/27/23 173 lb 3.2 oz (78.6 kg)  12/01/21 171 lb (77.6 kg)  05/19/21 164 lb (74.4 kg)     GEN:  Well nourished, well developed in no acute distress HEENT: Normal NECK: No JVD; No carotid bruits CARDIAC: RRR, no murmurs, rubs, gallops RESPIRATORY:  Clear to auscultation without rales, wheezing or rhonchi  ABDOMEN: Soft, non-tender, non-distended MUSCULOSKELETAL:  No edema; No deformity. 2+ pedal pulses, equal bilaterally SKIN: Warm and dry NEUROLOGIC:  Alert and oriented x 3 PSYCHIATRIC:  Normal affect   EKG:  EKG is ordered today.  The ekg ordered today demonstrates sinus bradycardia at 49 bpm with sinus arrhythmia, no ST abnormality   Diagnoses:    1. Coronary artery disease involving native coronary artery of native heart without angina pectoris   2. S/P CABG x 6   3. Hyperlipidemia LDL goal <70   4. Sinus bradycardia   5. Other fatigue    Assessment and Plan:     CAD without angina: s/p CABG x 6 03/2021. He is overall feeling well with no c/o chest pain, palpitations, dyspnea. Has mild fatigue but no presyncope, syncope. As noted below, with soft BP and bradycardia, will lower dose of beta blocker.  No indication for further ischemia evaluation at this time. Continue secondary prevention with heart healthy, mostly plant based diet, 150 minutes moderate intensity exercise, continued good BP control. Adding ezetimibe for goal LDL < 55. No bleeding concerns on aspirin.  Continue aspirin, atorvastatin, metoprolol.  Hyperlipidemia LDL goal < 55: LDL 70 on 09/02/2022. LDL previously lower. He admits to eating/drinking whole milk products, otherwise eats heart healthy diet. We will add ezetimibe 10 mg daily to atorvastatin 80 mg daily. Will have him return in 2 to 3 months for fasting  FLP/ALT.  Fatigue: Gradual increase in fatigue over past year or longer. Compares current energy level to when he lived in [place] and hiked frequently. Admits to decrease in  activity level over the past year. He has soft BP and bradycardia. We will go ahead and reduce beta-blocker in hopes of slightly increasing HR and BP to improve energy level. No bleeding concerns, stable hgb on lab work from 08/2022.   Sinus bradycardia: HR 49 bpm today which he reports is consistent with recent measurements. Also has soft BP today. Has not taken metoprolol yet today.  We will reduce metoprolol to tartrate to 12.5 mg twice daily and have him continue to monitor.     Disposition: 1 year with Dr. Eldridge Green  Medication Adjustments/Labs and Tests Ordered: Current medicines are reviewed at length with the patient today.  Concerns regarding medicines are outlined above.  Orders Placed This Encounter  Procedures   ALT   Lipid panel   EKG 12-Lead   Meds ordered this encounter  Medications   ezetimibe (ZETIA) 10 MG tablet    Sig: Take 1 tablet (10 mg total) by mouth daily.    Dispense:  90 tablet    Refill:  3   metoprolol tartrate (LOPRESSOR) 25 MG tablet    Sig: Take 0.5 tablets (12.5 mg total) by mouth 2 (two) times daily.    Dispense:  90 tablet    Refill:  3    Pt must keep upcoming appt in May 2024 with Cardiologist before anymore refills. Thank you Final Attempt    Patient Instructions  Medication Instructions:  Your physician has recommended you make the following change in your medication:  DECREASE: Metoprolol Lopressor 12.5mg  twice daily. START: Zetia 10mg  daily.  *If you need a refill on your cardiac medications before your next appointment, please call your pharmacy*   Lab Work: Your physician recommends that you return the 1st or 2nd week in July on a [facility] to have the following labs drawn: Lipid and ALT  If you have labs (blood work) drawn today and your tests are completely normal,  you will receive your results only by: MyChart Message (if you have MyChart) OR A paper copy in the mail If you have any lab test that is abnormal or we need to change your treatment, we will call you to review the results.   Testing/Procedures: NONE   Follow-Up: At [facility], you and your health needs are our priority.  As part of our continuing mission to provide you with exceptional heart care, we have created designated Provider Care Teams.  These Care Teams include your primary Cardiologist (physician) and Advanced Practice Providers (APPs -  Physician Assistants and Nurse Practitioners) who all work together to provide you with the care you need, when you need it.  We recommend signing up for the patient portal called "MyChart".  Sign up information is provided on this After Visit Summary.  MyChart is used to connect with patients for Virtual Visits (Telemedicine).  Patients are able to view lab/test results, encounter notes, upcoming appointments, etc.  Non-urgent messages can be sent to your provider as well.   To learn more about what you can do with MyChart, go to [email or web address].    Your next appointment:   1 year(s)  Provider:   Lance Muss, MD      Signed, Levi Aland, NP  03/27/2023 9:54 AM    [facility]

## 2023-03-27 ENCOUNTER — Ambulatory Visit: Payer: Federal, State, Local not specified - PPO | Attending: Nurse Practitioner | Admitting: Nurse Practitioner

## 2023-03-27 ENCOUNTER — Encounter: Payer: Self-pay | Admitting: Nurse Practitioner

## 2023-03-27 VITALS — BP 102/60 | HR 49 | Ht 69.0 in | Wt 173.2 lb

## 2023-03-27 DIAGNOSIS — I2510 Atherosclerotic heart disease of native coronary artery without angina pectoris: Secondary | ICD-10-CM | POA: Diagnosis not present

## 2023-03-27 DIAGNOSIS — Z951 Presence of aortocoronary bypass graft: Secondary | ICD-10-CM

## 2023-03-27 DIAGNOSIS — E785 Hyperlipidemia, unspecified: Secondary | ICD-10-CM

## 2023-03-27 DIAGNOSIS — R001 Bradycardia, unspecified: Secondary | ICD-10-CM

## 2023-03-27 DIAGNOSIS — R5383 Other fatigue: Secondary | ICD-10-CM

## 2023-03-27 MED ORDER — METOPROLOL TARTRATE 25 MG PO TABS: 12.5000 mg | ORAL_TABLET | Freq: Two times a day (BID) | ORAL | 3 refills | Status: DC

## 2023-03-27 MED ORDER — EZETIMIBE 10 MG PO TABS: 10.0000 mg | ORAL_TABLET | Freq: Every day | ORAL | 3 refills | Status: DC

## 2023-03-27 NOTE — Patient Instructions (Signed)
Medication Instructions:  Your physician has recommended you make the following change in your medication:  DECREASE: Metoprolol Lopressor 12.5mg  twice daily. START: Zetia 10mg  daily.  *If you need a refill on your cardiac medications before your next appointment, please call your pharmacy*   Lab Work: Your physician recommends that you return the 1st or 2nd week in July on a [facility] to have the following labs drawn: Lipid and ALT  If you have labs (blood work) drawn today and your tests are completely normal, you will receive your results only by: MyChart Message (if you have MyChart) OR A paper copy in the mail If you have any lab test that is abnormal or we need to change your treatment, we will call you to review the results.   Testing/Procedures: NONE   Follow-Up: At [facility], you and your health needs are our priority.  As part of our continuing mission to provide you with exceptional heart care, we have created designated Provider Care Teams.  These Care Teams include your primary Cardiologist (physician) and Advanced Practice Providers (APPs -  Physician Assistants and Nurse Practitioners) who all work together to provide you with the care you need, when you need it.  We recommend signing up for the patient portal called "MyChart".  Sign up information is provided on this After Visit Summary.  MyChart is used to connect with patients for Virtual Visits (Telemedicine).  Patients are able to view lab/test results, encounter notes, upcoming appointments, etc.  Non-urgent messages can be sent to your provider as well.   To learn more about what you can do with MyChart, go to [email or web address].    Your next appointment:   1 year(s)  Provider:   Lance Muss, MD

## 2023-05-31 ENCOUNTER — Ambulatory Visit: Payer: Federal, State, Local not specified - PPO | Attending: Nurse Practitioner

## 2023-05-31 DIAGNOSIS — I2510 Atherosclerotic heart disease of native coronary artery without angina pectoris: Secondary | ICD-10-CM

## 2023-05-31 DIAGNOSIS — E785 Hyperlipidemia, unspecified: Secondary | ICD-10-CM

## 2023-06-01 LAB — LIPID PANEL
Chol/HDL Ratio: 2.6 ratio (ref 0.0–5.0)
Cholesterol, Total: 118 mg/dL (ref 100–199)
HDL: 46 mg/dL (ref >39)
LDL Chol Calc (NIH): 55 mg/dL (ref 0–99)
Triglycerides: 90 mg/dL (ref 0–149)
VLDL Cholesterol Cal: 17 mg/dL (ref 5–40)

## 2023-06-01 LAB — ALT: ALT: 24 IU/L (ref 0–44)

## 2023-11-29 ENCOUNTER — Other Ambulatory Visit: Payer: Self-pay | Admitting: Interventional Cardiology

## 2023-11-29 DIAGNOSIS — I25118 Atherosclerotic heart disease of native coronary artery with other forms of angina pectoris: Secondary | ICD-10-CM

## 2023-11-29 DIAGNOSIS — Z951 Presence of aortocoronary bypass graft: Secondary | ICD-10-CM

## 2024-02-27 ENCOUNTER — Other Ambulatory Visit: Payer: Self-pay | Admitting: Nurse Practitioner

## 2024-04-07 ENCOUNTER — Other Ambulatory Visit: Payer: Self-pay | Admitting: Nurse Practitioner

## 2024-04-07 DIAGNOSIS — Z951 Presence of aortocoronary bypass graft: Secondary | ICD-10-CM

## 2024-04-07 DIAGNOSIS — I2510 Atherosclerotic heart disease of native coronary artery without angina pectoris: Secondary | ICD-10-CM

## 2024-05-02 ENCOUNTER — Other Ambulatory Visit: Payer: Self-pay | Admitting: Nurse Practitioner

## 2024-05-02 DIAGNOSIS — Z951 Presence of aortocoronary bypass graft: Secondary | ICD-10-CM

## 2024-05-02 DIAGNOSIS — I2510 Atherosclerotic heart disease of native coronary artery without angina pectoris: Secondary | ICD-10-CM

## 2024-05-07 ENCOUNTER — Other Ambulatory Visit: Payer: Self-pay | Admitting: Nurse Practitioner

## 2024-05-07 DIAGNOSIS — I2510 Atherosclerotic heart disease of native coronary artery without angina pectoris: Secondary | ICD-10-CM

## 2024-05-07 DIAGNOSIS — Z951 Presence of aortocoronary bypass graft: Secondary | ICD-10-CM

## 2024-05-17 ENCOUNTER — Other Ambulatory Visit: Payer: Self-pay | Admitting: Nurse Practitioner

## 2024-05-17 DIAGNOSIS — I2510 Atherosclerotic heart disease of native coronary artery without angina pectoris: Secondary | ICD-10-CM

## 2024-05-17 DIAGNOSIS — Z951 Presence of aortocoronary bypass graft: Secondary | ICD-10-CM

## 2024-05-25 ENCOUNTER — Other Ambulatory Visit: Payer: Self-pay | Admitting: Interventional Cardiology

## 2024-05-25 DIAGNOSIS — I25118 Atherosclerotic heart disease of native coronary artery with other forms of angina pectoris: Secondary | ICD-10-CM

## 2024-05-25 DIAGNOSIS — Z951 Presence of aortocoronary bypass graft: Secondary | ICD-10-CM

## 2024-05-26 ENCOUNTER — Other Ambulatory Visit: Payer: Self-pay | Admitting: Nurse Practitioner

## 2024-06-04 ENCOUNTER — Encounter: Payer: Self-pay | Admitting: Cardiovascular Disease

## 2024-06-04 ENCOUNTER — Ambulatory Visit: Attending: Cardiovascular Disease | Admitting: Cardiovascular Disease

## 2024-06-04 VITALS — BP 96/52 | HR 43 | Ht 69.0 in | Wt 172.6 lb

## 2024-06-04 DIAGNOSIS — R001 Bradycardia, unspecified: Secondary | ICD-10-CM | POA: Diagnosis not present

## 2024-06-04 DIAGNOSIS — I2510 Atherosclerotic heart disease of native coronary artery without angina pectoris: Secondary | ICD-10-CM

## 2024-06-04 DIAGNOSIS — E785 Hyperlipidemia, unspecified: Secondary | ICD-10-CM

## 2024-06-04 DIAGNOSIS — I10 Essential (primary) hypertension: Secondary | ICD-10-CM | POA: Insufficient documentation

## 2024-06-04 DIAGNOSIS — Z951 Presence of aortocoronary bypass graft: Secondary | ICD-10-CM

## 2024-06-04 NOTE — Assessment & Plan Note (Addendum)
 History of hyperlipidemia on high-dose statin therapy and Zetia  with lipid profile performed 05/31/2023 revealing a total cholesterol of 118, LDL of 55 and HDL of 46.

## 2024-06-04 NOTE — Progress Notes (Signed)
 06/04/2024 Andres Green   1946/10/22  [id number]  Primary Physician Lanis Thresa BROCKS, PA-C Primary Cardiologist: Dorn JINNY Lesches MD [facility] [place], [place]  HPI:  Andres Green is a 78 y.o. thin-appearing married Caucasian male father of 1 adopted autistic child who is now 43 years old formally a patient of Dr. Johnice.  I am assuming his care in his absence.  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  His risk factors include treated hypertension and hyperlipidemia.  His father apparently had CAD.  He had a coronary CTA performed 03/22/2021 revealing a coronary calcium  score of 392 which ultimately led to cardiac catheterization by Dr. Dann 03/30/2021 that revealed three-vessel disease with normal LV function.  He underwent CABG x 6 by Dr. Lucas 04/08/2021 with a LIMA to his LAD, vein to the first diagonal branch, sequential vein to OM1/OM 2, sequential vein to PDA/PLA.  His postop course was unremarkable.  He has remained stable since that time.  He walks his new puppy without symptoms.   Current Meds  Medication Sig   aspirin  EC 81 MG tablet Take 1 tablet (81 mg total) by mouth daily. Swallow whole.   atorvastatin  (LIPITOR ) 80 MG tablet TAKE 1 TABLET BY MOUTH EVERY DAY   ezetimibe  (ZETIA ) 10 MG tablet TAKE 1 TABLET BY MOUTH EVERY DAY   metoprolol  tartrate (LOPRESSOR ) 25 MG tablet TAKE 1 TABLET BY MOUTH TWICE A DAY     No Known Allergies  Social History   Socioeconomic History   Marital status: Married    Spouse name: Not on file   Number of children: Not on file   Years of education: Not on file   Highest education level: Not on file  Occupational History   Not on file  Tobacco Use   Smoking status: Never   Smokeless tobacco: Never  Vaping Use   Vaping status: Never Used  Substance and Sexual Activity   Alcohol use: Yes    Comment: 1 BEER/ 1 GLASS WINE X 5 WEEKLY   Drug use: Yes     Types: Marijuana    Comment: 40 YEARS AGO   Sexual activity: Not on file  Other Topics Concern   Not on file  Social History Narrative   Not on file   Social Drivers of Health   Financial Resource Strain: Not on file  Food Insecurity: Not on file  Transportation Needs: Not on file  Physical Activity: Not on file  Stress: Not on file  Social Connections: Not on file  Intimate Partner Violence: Not on file     Review of Systems: General: negative for chills, fever, night sweats or weight changes.  Cardiovascular: negative for chest pain, dyspnea on exertion, edema, orthopnea, palpitations, paroxysmal nocturnal dyspnea or shortness of breath Dermatological: negative for rash Respiratory: negative for cough or wheezing Urologic: negative for hematuria Abdominal: negative for nausea, vomiting, diarrhea, bright red blood per rectum, melena, or hematemesis Neurologic: negative for visual changes, syncope, or dizziness All other systems reviewed and are otherwise negative except as noted above.    Blood pressure (!) 96/52, pulse (!) 43, height 5' 9 (1.753 m), weight 172 lb 9.6 oz (78.3 kg), SpO2 99%.  General appearance: alert and no distress Neck: no adenopathy, no carotid bruit, no JVD, supple, symmetrical, trachea midline, and thyroid not enlarged, symmetric, no tenderness/mass/nodules Lungs: clear to auscultation bilaterally Heart: regular rate and  rhythm, S1, S2 normal, no murmur, click, rub or gallop Extremities: extremities normal, atraumatic, no cyanosis or edema Pulses: 2+ and symmetric Skin: Skin color, texture, turgor normal. No rashes or lesions Neurologic: Grossly normal  EKG EKG Interpretation Date/Time:  Tuesday June 04 2024 10:56:42 EDT Ventricular Rate:  43 PR Interval:  198 QRS Duration:  92 QT Interval:  452 QTC Calculation: 381 R Axis:   26  Text Interpretation: Marked sinus bradycardia When compared with ECG of 09-Apr-2021 06:56, Vent. rate has  decreased BY  26 BPM QT has shortened Confirmed by Court Carrier [phone]) on 06/04/2024 11:24:33 AM    ASSESSMENT AND PLAN:   S/P CABG x 6 History of CAD status post cardiac cath performed by Dr. Dann 03/30/2021 revealing normal LV systolic function with three-vessel disease.  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  He has remained asymptomatic since.  Hyperlipidemia LDL goal <70 History of hyperlipidemia on high-dose statin therapy and Zetia  with lipid profile performed 05/31/2023 revealing a total cholesterol of 118, LDL of 55 and HDL of 46.  Essential hypertension History of essential hypertension blood pressure measured today at 96/52.  He is on metoprolol .     Carrier DOROTHA Court MD FACP,FACC,FAHA, [facility] [place] 06/04/2024 11:38 AM

## 2024-06-04 NOTE — Assessment & Plan Note (Signed)
 History of essential hypertension blood pressure measured today at 96/52.  He is on metoprolol .

## 2024-06-04 NOTE — Assessment & Plan Note (Signed)
 History of CAD status post cardiac cath performed by Dr. Dann 03/30/2021 revealing normal LV systolic function with three-vessel disease.  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  He has remained asymptomatic since.

## 2024-06-04 NOTE — Patient Instructions (Signed)

## 2024-06-20 ENCOUNTER — Other Ambulatory Visit: Payer: Self-pay | Admitting: Interventional Cardiology

## 2024-06-20 ENCOUNTER — Other Ambulatory Visit: Payer: Self-pay | Admitting: Nurse Practitioner

## 2024-06-20 DIAGNOSIS — I25118 Atherosclerotic heart disease of native coronary artery with other forms of angina pectoris: Secondary | ICD-10-CM

## 2024-06-20 DIAGNOSIS — Z951 Presence of aortocoronary bypass graft: Secondary | ICD-10-CM

## 2024-08-17 ENCOUNTER — Other Ambulatory Visit: Payer: Self-pay | Admitting: Nurse Practitioner

## 2024-08-17 DIAGNOSIS — I2510 Atherosclerotic heart disease of native coronary artery without angina pectoris: Secondary | ICD-10-CM

## 2024-08-17 DIAGNOSIS — Z951 Presence of aortocoronary bypass graft: Secondary | ICD-10-CM

## 2024-12-13 ENCOUNTER — Other Ambulatory Visit: Payer: Self-pay | Admitting: Cardiovascular Disease
